# Patient Record
Sex: Female | Born: 1945 | Race: White | Hispanic: No | Smoking: Former smoker
Health system: Southern US, Community
[De-identification: ages and names within clinical notes are randomized; demographics above are authoritative.]

## PROBLEM LIST (undated history)

## (undated) DIAGNOSIS — J449 Chronic obstructive pulmonary disease, unspecified: Secondary | ICD-10-CM

## (undated) DIAGNOSIS — F419 Anxiety disorder, unspecified: Secondary | ICD-10-CM

## (undated) DIAGNOSIS — G8929 Other chronic pain: Secondary | ICD-10-CM

## (undated) DIAGNOSIS — I10 Essential (primary) hypertension: Secondary | ICD-10-CM

## (undated) DIAGNOSIS — C55 Malignant neoplasm of uterus, part unspecified: Secondary | ICD-10-CM

## (undated) HISTORY — DX: Essential (primary) hypertension: I10

## (undated) HISTORY — PX: TOTAL ABDOMINAL HYSTERECTOMY: SHX209

## (undated) HISTORY — DX: Malignant neoplasm of uterus, part unspecified: C55

## (undated) HISTORY — PX: NECK SURGERY: SHX720

## (undated) HISTORY — DX: Anxiety disorder, unspecified: F41.9

## (undated) HISTORY — PX: BILATERAL CARPAL TUNNEL RELEASE: SHX6508

## (undated) HISTORY — DX: Chronic obstructive pulmonary disease, unspecified: J44.9

## (undated) HISTORY — DX: Other chronic pain: G89.29

## (undated) HISTORY — PX: GALLBLADDER SURGERY: SHX652

---

## 1998-07-25 ENCOUNTER — Ambulatory Visit (HOSPITAL_COMMUNITY): Admission: RE | Admit: 1998-07-25 | Discharge: 1998-07-25 | Payer: Self-pay | Admitting: Nephrology

## 1998-08-15 ENCOUNTER — Ambulatory Visit (HOSPITAL_COMMUNITY): Admission: RE | Admit: 1998-08-15 | Discharge: 1998-08-15 | Payer: Self-pay | Admitting: Nephrology

## 1998-10-18 ENCOUNTER — Other Ambulatory Visit: Admission: RE | Admit: 1998-10-18 | Discharge: 1998-10-18 | Payer: Self-pay | Admitting: Nephrology

## 1998-11-28 ENCOUNTER — Encounter: Admission: RE | Admit: 1998-11-28 | Discharge: 1999-01-09 | Payer: Self-pay | Admitting: Neurosurgery

## 1999-01-19 ENCOUNTER — Other Ambulatory Visit: Admission: RE | Admit: 1999-01-19 | Discharge: 1999-01-19 | Payer: Self-pay | Admitting: *Deleted

## 2000-03-08 ENCOUNTER — Encounter: Payer: Self-pay | Admitting: Emergency Medicine

## 2000-03-08 ENCOUNTER — Emergency Department (HOSPITAL_COMMUNITY): Admission: EM | Admit: 2000-03-08 | Discharge: 2000-03-08 | Payer: Self-pay | Admitting: Emergency Medicine

## 2002-12-21 ENCOUNTER — Encounter: Payer: Self-pay | Admitting: Family Medicine

## 2002-12-21 ENCOUNTER — Ambulatory Visit (HOSPITAL_COMMUNITY): Admission: RE | Admit: 2002-12-21 | Discharge: 2002-12-21 | Payer: Self-pay | Admitting: Family Medicine

## 2002-12-30 ENCOUNTER — Encounter: Payer: Self-pay | Admitting: Internal Medicine

## 2002-12-30 ENCOUNTER — Ambulatory Visit (HOSPITAL_COMMUNITY): Admission: RE | Admit: 2002-12-30 | Discharge: 2002-12-30 | Payer: Self-pay | Admitting: Internal Medicine

## 2003-01-07 ENCOUNTER — Ambulatory Visit (HOSPITAL_COMMUNITY): Admission: RE | Admit: 2003-01-07 | Discharge: 2003-01-07 | Payer: Self-pay | Admitting: Internal Medicine

## 2003-01-07 ENCOUNTER — Encounter: Payer: Self-pay | Admitting: Internal Medicine

## 2003-01-13 ENCOUNTER — Encounter: Payer: Self-pay | Admitting: Internal Medicine

## 2003-01-13 ENCOUNTER — Ambulatory Visit (HOSPITAL_COMMUNITY): Admission: RE | Admit: 2003-01-13 | Discharge: 2003-01-13 | Payer: Self-pay | Admitting: Internal Medicine

## 2003-02-09 ENCOUNTER — Encounter: Admission: RE | Admit: 2003-02-09 | Discharge: 2003-02-09 | Payer: Self-pay | Admitting: *Deleted

## 2003-02-09 ENCOUNTER — Encounter (INDEPENDENT_AMBULATORY_CARE_PROVIDER_SITE_OTHER): Payer: Self-pay | Admitting: Specialist

## 2003-02-09 ENCOUNTER — Other Ambulatory Visit: Admission: RE | Admit: 2003-02-09 | Discharge: 2003-02-09 | Payer: Self-pay | Admitting: *Deleted

## 2003-02-23 ENCOUNTER — Ambulatory Visit: Admission: RE | Admit: 2003-02-23 | Discharge: 2003-02-23 | Payer: Self-pay | Admitting: Gynecology

## 2003-05-26 ENCOUNTER — Encounter (INDEPENDENT_AMBULATORY_CARE_PROVIDER_SITE_OTHER): Payer: Self-pay | Admitting: Specialist

## 2003-05-26 ENCOUNTER — Ambulatory Visit: Admission: RE | Admit: 2003-05-26 | Discharge: 2003-05-26 | Payer: Self-pay | Admitting: Gynecology

## 2003-05-26 ENCOUNTER — Encounter: Payer: Self-pay | Admitting: Gynecology

## 2003-06-23 ENCOUNTER — Ambulatory Visit: Admission: RE | Admit: 2003-06-23 | Discharge: 2003-06-23 | Payer: Self-pay | Admitting: Gynecology

## 2003-06-24 ENCOUNTER — Encounter: Payer: Self-pay | Admitting: Gynecology

## 2003-06-29 ENCOUNTER — Observation Stay (HOSPITAL_COMMUNITY): Admission: RE | Admit: 2003-06-29 | Discharge: 2003-06-30 | Payer: Self-pay | Admitting: Obstetrics & Gynecology

## 2003-06-29 ENCOUNTER — Encounter (INDEPENDENT_AMBULATORY_CARE_PROVIDER_SITE_OTHER): Payer: Self-pay

## 2003-08-04 ENCOUNTER — Ambulatory Visit: Admission: RE | Admit: 2003-08-04 | Discharge: 2003-08-04 | Payer: Self-pay | Admitting: Gynecology

## 2003-12-08 ENCOUNTER — Encounter (INDEPENDENT_AMBULATORY_CARE_PROVIDER_SITE_OTHER): Payer: Self-pay | Admitting: Specialist

## 2003-12-08 ENCOUNTER — Other Ambulatory Visit: Admission: RE | Admit: 2003-12-08 | Discharge: 2003-12-08 | Payer: Self-pay | Admitting: Gynecology

## 2003-12-08 ENCOUNTER — Ambulatory Visit: Admission: RE | Admit: 2003-12-08 | Discharge: 2003-12-08 | Payer: Self-pay | Admitting: Gynecology

## 2004-06-21 ENCOUNTER — Encounter (INDEPENDENT_AMBULATORY_CARE_PROVIDER_SITE_OTHER): Payer: Self-pay | Admitting: *Deleted

## 2004-06-21 ENCOUNTER — Ambulatory Visit: Admission: RE | Admit: 2004-06-21 | Discharge: 2004-06-21 | Payer: Self-pay | Admitting: Gynecology

## 2004-06-21 ENCOUNTER — Other Ambulatory Visit: Admission: RE | Admit: 2004-06-21 | Discharge: 2004-06-21 | Payer: Self-pay | Admitting: Gynecology

## 2005-04-23 ENCOUNTER — Emergency Department: Payer: Self-pay | Admitting: Emergency Medicine

## 2005-07-05 ENCOUNTER — Other Ambulatory Visit: Admission: RE | Admit: 2005-07-05 | Discharge: 2005-07-05 | Payer: Self-pay | Admitting: Family Medicine

## 2006-03-12 ENCOUNTER — Encounter: Admission: RE | Admit: 2006-03-12 | Discharge: 2006-03-12 | Payer: Self-pay | Admitting: Family Medicine

## 2006-07-04 ENCOUNTER — Encounter: Admission: RE | Admit: 2006-07-04 | Discharge: 2006-07-04 | Payer: Self-pay | Admitting: Family Medicine

## 2007-06-18 ENCOUNTER — Ambulatory Visit (HOSPITAL_COMMUNITY): Admission: RE | Admit: 2007-06-18 | Discharge: 2007-06-19 | Payer: Self-pay | Admitting: Orthopaedic Surgery

## 2007-09-17 ENCOUNTER — Ambulatory Visit: Payer: Self-pay | Admitting: Internal Medicine

## 2007-09-17 ENCOUNTER — Encounter (INDEPENDENT_AMBULATORY_CARE_PROVIDER_SITE_OTHER): Payer: Self-pay | Admitting: Family Medicine

## 2007-09-17 ENCOUNTER — Ambulatory Visit: Payer: Self-pay | Admitting: *Deleted

## 2007-09-17 LAB — CONVERTED CEMR LAB
Albumin: 4.6 g/dL (ref 3.5–5.2)
Alkaline Phosphatase: 72 units/L (ref 39–117)
CO2: 24 meq/L (ref 19–32)
Cholesterol: 226 mg/dL — ABNORMAL HIGH (ref 0–200)
Creatinine, Ser: 0.83 mg/dL (ref 0.40–1.20)
HDL: 62 mg/dL (ref >39)
Potassium: 4.5 meq/L (ref 3.5–5.3)
Sodium: 138 meq/L (ref 135–145)
Total Protein: 7.6 g/dL (ref 6.0–8.3)
Triglycerides: 232 mg/dL — ABNORMAL HIGH (ref <150)

## 2007-10-22 ENCOUNTER — Ambulatory Visit: Payer: Self-pay | Admitting: Internal Medicine

## 2007-11-04 ENCOUNTER — Ambulatory Visit (HOSPITAL_COMMUNITY): Admission: RE | Admit: 2007-11-04 | Discharge: 2007-11-04 | Payer: Self-pay | Admitting: Family Medicine

## 2007-12-02 ENCOUNTER — Ambulatory Visit: Payer: Self-pay | Admitting: Internal Medicine

## 2007-12-02 ENCOUNTER — Encounter (INDEPENDENT_AMBULATORY_CARE_PROVIDER_SITE_OTHER): Payer: Self-pay | Admitting: Family Medicine

## 2007-12-02 LAB — CONVERTED CEMR LAB
Albumin: 4.1 g/dL (ref 3.5–5.2)
Alkaline Phosphatase: 66 units/L (ref 39–117)
BUN: 12 mg/dL (ref 6–23)
Calcium: 9.9 mg/dL (ref 8.4–10.5)
Glucose, Bld: 95 mg/dL (ref 70–99)
HDL: 52 mg/dL (ref >39)
LDL Cholesterol: 123 mg/dL — ABNORMAL HIGH (ref 0–99)
Total CHOL/HDL Ratio: 4.1
Total Protein: 7.1 g/dL (ref 6.0–8.3)
Triglycerides: 202 mg/dL — ABNORMAL HIGH (ref <150)
VLDL: 40 mg/dL (ref 0–40)

## 2007-12-09 ENCOUNTER — Ambulatory Visit: Payer: Self-pay | Admitting: Internal Medicine

## 2007-12-09 ENCOUNTER — Encounter: Payer: Self-pay | Admitting: Family Medicine

## 2007-12-23 ENCOUNTER — Ambulatory Visit: Payer: Self-pay | Admitting: Family Medicine

## 2008-01-06 ENCOUNTER — Ambulatory Visit (HOSPITAL_COMMUNITY): Admission: RE | Admit: 2008-01-06 | Discharge: 2008-01-06 | Payer: Self-pay | Admitting: Family Medicine

## 2008-01-29 ENCOUNTER — Encounter: Admission: RE | Admit: 2008-01-29 | Discharge: 2008-03-11 | Payer: Self-pay | Admitting: Family Medicine

## 2008-03-08 ENCOUNTER — Encounter (INDEPENDENT_AMBULATORY_CARE_PROVIDER_SITE_OTHER): Payer: Self-pay | Admitting: Family Medicine

## 2008-03-08 ENCOUNTER — Ambulatory Visit: Payer: Self-pay | Admitting: Family Medicine

## 2008-03-08 LAB — CONVERTED CEMR LAB
Basophils Absolute: 0.1 10*3/uL (ref 0.0–0.1)
HCT: 42.6 % (ref 36.0–46.0)
Hemoglobin: 14.1 g/dL (ref 12.0–15.0)
Lymphs Abs: 5.6 10*3/uL — ABNORMAL HIGH (ref 0.7–4.0)
Neutrophils Relative %: 53 % (ref 43–77)
Platelets: 407 10*3/uL — ABNORMAL HIGH (ref 150–400)
RBC: 4.69 M/uL (ref 3.87–5.11)
RDW: 13 % (ref 11.5–15.5)
WBC: 16.4 10*3/uL — ABNORMAL HIGH (ref 4.0–10.5)

## 2008-03-12 ENCOUNTER — Emergency Department: Payer: Self-pay | Admitting: Emergency Medicine

## 2008-03-25 ENCOUNTER — Ambulatory Visit (HOSPITAL_COMMUNITY): Admission: RE | Admit: 2008-03-25 | Discharge: 2008-03-25 | Payer: Self-pay | Admitting: Family Medicine

## 2008-04-19 ENCOUNTER — Ambulatory Visit: Payer: Self-pay | Admitting: Internal Medicine

## 2008-05-11 ENCOUNTER — Ambulatory Visit: Payer: Self-pay | Admitting: Internal Medicine

## 2008-05-20 ENCOUNTER — Ambulatory Visit (HOSPITAL_COMMUNITY): Admission: RE | Admit: 2008-05-20 | Discharge: 2008-05-20 | Payer: Self-pay | Admitting: Family Medicine

## 2008-06-10 ENCOUNTER — Encounter (INDEPENDENT_AMBULATORY_CARE_PROVIDER_SITE_OTHER): Payer: Self-pay | Admitting: Family Medicine

## 2008-06-10 ENCOUNTER — Ambulatory Visit: Payer: Self-pay | Admitting: Internal Medicine

## 2008-06-10 LAB — CONVERTED CEMR LAB
ALT: 14 units/L (ref 0–35)
Albumin: 4.4 g/dL (ref 3.5–5.2)
BUN: 9 mg/dL (ref 6–23)
CO2: 27 meq/L (ref 19–32)
Calcium: 9.6 mg/dL (ref 8.4–10.5)
Chloride: 103 meq/L (ref 96–112)
Cholesterol: 150 mg/dL (ref 0–200)
Creatinine, Ser: 0.82 mg/dL (ref 0.40–1.20)
Glucose, Bld: 105 mg/dL — ABNORMAL HIGH (ref 70–99)
HDL: 57 mg/dL (ref >39)
Total CHOL/HDL Ratio: 2.6
Triglycerides: 101 mg/dL (ref <150)

## 2008-06-18 ENCOUNTER — Ambulatory Visit: Payer: Self-pay | Admitting: Internal Medicine

## 2008-07-13 ENCOUNTER — Ambulatory Visit: Payer: Self-pay | Admitting: Internal Medicine

## 2008-10-05 ENCOUNTER — Ambulatory Visit: Payer: Self-pay | Admitting: Family Medicine

## 2008-11-25 ENCOUNTER — Ambulatory Visit: Payer: Self-pay | Admitting: Family Medicine

## 2008-12-08 ENCOUNTER — Ambulatory Visit (HOSPITAL_COMMUNITY): Admission: RE | Admit: 2008-12-08 | Discharge: 2008-12-08 | Payer: Self-pay | Admitting: Family Medicine

## 2008-12-09 ENCOUNTER — Ambulatory Visit (HOSPITAL_COMMUNITY): Admission: RE | Admit: 2008-12-09 | Discharge: 2008-12-09 | Payer: Self-pay | Admitting: Internal Medicine

## 2009-01-27 ENCOUNTER — Ambulatory Visit: Payer: Self-pay | Admitting: Internal Medicine

## 2009-03-08 ENCOUNTER — Ambulatory Visit: Payer: Self-pay | Admitting: Internal Medicine

## 2009-03-22 ENCOUNTER — Ambulatory Visit: Payer: Self-pay | Admitting: Family Medicine

## 2009-08-03 ENCOUNTER — Ambulatory Visit: Payer: Self-pay | Admitting: Internal Medicine

## 2009-08-03 ENCOUNTER — Encounter (INDEPENDENT_AMBULATORY_CARE_PROVIDER_SITE_OTHER): Payer: Self-pay | Admitting: Adult Health

## 2009-08-03 LAB — CONVERTED CEMR LAB
Albumin: 4.1 g/dL (ref 3.5–5.2)
Alkaline Phosphatase: 77 units/L (ref 39–117)
Basophils Absolute: 0.1 10*3/uL (ref 0.0–0.1)
Basophils Relative: 0 % (ref 0–1)
CO2: 24 meq/L (ref 19–32)
Calcium: 9.5 mg/dL (ref 8.4–10.5)
Cholesterol: 152 mg/dL (ref 0–200)
Creatinine, Ser: 0.77 mg/dL (ref 0.40–1.20)
Eosinophils Absolute: 0.7 10*3/uL (ref 0.0–0.7)
Glucose, Bld: 66 mg/dL — ABNORMAL LOW (ref 70–99)
HDL: 53 mg/dL (ref >39)
Monocytes Absolute: 1.2 10*3/uL — ABNORMAL HIGH (ref 0.1–1.0)
Platelets: 447 10*3/uL — ABNORMAL HIGH (ref 150–400)
Sodium: 138 meq/L (ref 135–145)
Total Bilirubin: 0.4 mg/dL (ref 0.3–1.2)
Total CHOL/HDL Ratio: 2.9

## 2009-08-04 ENCOUNTER — Encounter (INDEPENDENT_AMBULATORY_CARE_PROVIDER_SITE_OTHER): Payer: Self-pay | Admitting: Adult Health

## 2009-08-23 ENCOUNTER — Ambulatory Visit: Payer: Self-pay | Admitting: Internal Medicine

## 2009-08-25 ENCOUNTER — Ambulatory Visit: Payer: Self-pay | Admitting: Internal Medicine

## 2009-08-25 ENCOUNTER — Encounter (INDEPENDENT_AMBULATORY_CARE_PROVIDER_SITE_OTHER): Payer: Self-pay | Admitting: Adult Health

## 2009-08-25 LAB — CONVERTED CEMR LAB
Eosinophils Absolute: 1 10*3/uL — ABNORMAL HIGH (ref 0.0–0.7)
Eosinophils Relative: 7 % — ABNORMAL HIGH (ref 0–5)
Hemoglobin, Urine: NEGATIVE
Ketones, ur: NEGATIVE mg/dL
Leukocytes, UA: NEGATIVE
Lymphocytes Relative: 27 % (ref 12–46)
MCHC: 31.2 g/dL (ref 30.0–36.0)
Monocytes Absolute: 1.4 10*3/uL — ABNORMAL HIGH (ref 0.1–1.0)
Neutrophils Relative %: 55 % (ref 43–77)
Protein, ur: NEGATIVE mg/dL
RBC: 4.46 M/uL (ref 3.87–5.11)
Specific Gravity, Urine: 1.01 (ref 1.005–1.030)
Urine Glucose: NEGATIVE mg/dL
Urobilinogen, UA: 0.2 (ref 0.0–1.0)
WBC: 13.7 10*3/uL — ABNORMAL HIGH (ref 4.0–10.5)

## 2009-08-26 ENCOUNTER — Encounter (INDEPENDENT_AMBULATORY_CARE_PROVIDER_SITE_OTHER): Payer: Self-pay | Admitting: Adult Health

## 2009-09-22 ENCOUNTER — Ambulatory Visit: Payer: Self-pay | Admitting: Internal Medicine

## 2010-12-07 ENCOUNTER — Ambulatory Visit: Payer: Self-pay | Admitting: Internal Medicine

## 2010-12-07 LAB — PULMONARY FUNCTION TEST

## 2010-12-27 ENCOUNTER — Telehealth (INDEPENDENT_AMBULATORY_CARE_PROVIDER_SITE_OTHER): Payer: Self-pay | Admitting: *Deleted

## 2011-01-25 NOTE — Progress Notes (Signed)
Summary: pft results  Phone Note From Other Clinic   Caller: dr Carollee Massed [phone] Summary of Call: Dr. Janee Morn asking for copy of PFT done 12/15.  Please fax to [phone] attn: Dr. Clovis Riley Initial call taken by: Lehman Prom,  December 27, 2010 8:46 AM  Follow-up for Phone Call        I have refaxed the PFT results-they were sent on 12-11-10 orginally.[name] CMA  December 27, 2010 10:34 AM

## 2011-01-25 NOTE — Miscellaneous (Signed)
Summary: Orders Update pft charges  Clinical Lists Changes  Orders: Added new Service order of Carbon Monoxide diffusing w/capacity (94720) - Signed Added new Service order of Lung Volumes (94240) - Signed Added new Service order of Spirometry (Pre & Post) (94060) - Signed 

## 2011-05-08 NOTE — Op Note (Signed)
Kelsey Long, Kelsey Long              ACCOUNT NO.:  [id number]   MEDICAL RECORD NO.:  [id number]          PATIENT TYPE:  AMB   LOCATION:  SDS                          FACILITY:  [facility]   PHYSICIAN:  Mark C. Ophelia Charter, M.D.    DATE OF BIRTH:  29-Dec-1945   DATE OF PROCEDURE:  06/18/2007  DATE OF DISCHARGE:                               OPERATIVE REPORT   POSTOPERATIVE DIAGNOSIS:  C5-C6 and C6-C7 spondylosis.   POSTOPERATIVE DIAGNOSIS:  C5-C6 and C6-C7 spondylosis.   PROCEDURE:  C5-C6 and C6-C7 anterior cervical discectomy and fusion,  allograft and plating, application of external bone stimulator.   SURGEON:  Mark C. Ophelia Charter, M.D.   ASSISTANT:  Wende Neighbors, P.A.-C.   ANESTHESIA:  GOT.   DRAINS:  One Hemovac neck.   DESCRIPTION OF PROCEDURE:  After induction of general anesthesia and  orotracheal intubation, head halter traction application without weight,  standard prepping of the neck was performed, sterile Mayo stand at the  head, the area squared with towels.  A sterile skin marker was used and  Betadine Vidrape and thyroid sheet.  An incision was made at the midline  after time out procedure and preoperative antibiotics.  The platysma was  split in line with the fibers.  Blunt dissection down to the prominent  spurs at C5-C6 and C6-c7 was performed.  A short 25 needle was placed at  C5-C6 and confirmed with C-arm lateral position.  C5-C6 level surgically  addressed first.  Discectomy was performed with scalpel, pituitary  Cloward curets.  The power bur was used 4 mm for removal of the anterior  prominent spurs and cautery was used to control some bleeding of the  longus colli.  The operating microscope was draped and brought in.  Continued discectomy back to the posterior longitudinal ligament. Spurs  were removed which were overhanging and there was more tightness on the  left than right.  The posterior longitudinal ligament was taken down.  There was extruded fragment out in  the right foramina which was  delivered and removed.  Once spurs were removed, the cord was  decompressed, the dura was well visualized.  Trial sizers showed that  additional bone had to be removed from the left side.  Initially, a 6 mm  was tried and it was not quite proper tension and a 7 mm lordotic graft  was selected after trial.  It was impacted into place, rotated slightly  on insertion since it slightly tighter on the left than right.  There  was good bone contact.  The graft was grasped with a Black nerve hook  and attempted to be pulled, was stable and extremely tight.  Self-  retaining Cloward tractors were then moved to C6-C7 and the procedure  was repeated.  The space was tighter with less motion minimal space.  Posterior longitudinal ligament was taken down. Sizing showed a 6 gave a  tight fit.  The graft was inserted, maintained in position, countersunk  slightly, spurs removed off the anterior aspect.  A plate was selected  which was a 30 mm Biomet Viewlock plate.  All six holes were filled,  checked under fluoroscopy. Fluoroscopy was oblique to see the bottom  screws in the C7 vertebra.  After irrigation with saline solution, final  spot fluoro pictures were taken including an  AP.  Instrument and needle count was correct and the platysma was closed  with 3-0 Vicryl, 4-0 Vicryl subcuticular closure.  Tincture of Benzoin,  Steri-Strips, Marcaine infiltration, postop dressing. Application of  bone stimulator patches and soft cervical collar.  The patient was  extubated and transferred to the recovery room in stable condition.      Mark C. Ophelia Charter, M.D.  Electronically Signed     MCY/MEDQ  D:  06/18/2007  T:  06/18/2007  Job:  [id number]

## 2011-05-11 NOTE — Consult Note (Signed)
NAME:  Kelsey Long, Kelsey Long                        ACCOUNT NO.:  [id number]   MEDICAL RECORD NO.:  [id number]                   PATIENT TYPE:  OUT   LOCATION:  GYN                                  FACILITY:  [facility]   PHYSICIAN:  De Blanch, M.D.         DATE OF BIRTH:  1946/07/21   DATE OF CONSULTATION:  05/26/2003  DATE OF DISCHARGE:                                   CONSULTATION   HISTORY OF PRESENT ILLNESS:  A 65 year old white female returns for follow-  up of an ovarian cyst and endometrial hyperplasia with atypia.  At our  initial visit we placed the patient on Provera 10 mg daily for the first 12  days of each month (the patient had refused hysterectomy which had been  advised for treatment of her hyperplasia with atypia).  She apparently has  tolerated the Provera well.  She has had minimal pinkish discharge and not  really had a menstrual period during the time that she has been on the  Provera.  She denies any other pelvic pain or pressure.  No vaginal bleeding  or discharge.  She has been working full-time and is in line to be promoted  further.   PAST MEDICAL HISTORY:  1. Hypertension.  2. Low back pain.   PAST SURGICAL HISTORY:  1. Laparoscopic surgery for endometriosis 1978.  2. Open cholecystectomy 1976.   PAST OBSTETRICAL HISTORY:  Gravida 3.   ALLERGIES:  PENICILLIN and SULFA.   SOCIAL HISTORY:  The patient smokes one pack per day.  She is working full-  time.   REVIEW OF SYSTEMS:  No cardiovascular, pulmonary, GI, GU symptoms.   PHYSICAL EXAMINATION:  VITAL SIGNS:  Weight 149 pounds.  GENERAL:  The patient is a healthy white female in no acute distress.  HEENT:  Negative.  NECK:  Supple without thyromegaly.  LYMPH:  There is no supraclavicular or inguinal adenopathy.  ABDOMEN:  Soft, nontender.  No mass, organomegaly, ascites, or hernias  noted.  PELVIC:  EGBUS, vagina, bladder, urethra are normal.  Cervix appears normal.  The uterus is  anterior, normal shape, size, consistency.  I do not feel any  adnexal masses.  Rectovaginal examination confirms.   PROCEDURE NOTE:  Endometrial biopsy is obtained without difficulty using a  Pipelle.  The patient is given ibuprofen following the procedure.   The patient also had an ultrasound earlier today which basically is  unchanged.  She remains with a right ovarian cyst measuring 6.4 x 3.8 x 5.8  cm.  The left ovary appears normal, has a small cyst which is 1.7 x 1.3 x  1.5 cm.  Endometrial stripe is 5.7 mm.   IMPRESSION:  Persistent stable ovarian cyst.   Endometrial hyperplasia with atypia status post three months of cyclic  Provera.   W will await the endometrial biopsy before making further recommendation.  If she continues to have hyperplasia, I have advised her that she should  have hysterectomy.  She is very resistant to this given her current work  situation.  Will therefore treat her with continuous Provera 10 mg daily for  the next three months and then reassess with biopsy.  We will also repeat an  ultrasound in three months to evaluate her ovarian cyst.                                               De Blanch, M.D.    DC/MEDQ  D:  05/26/2003  T:  05/26/2003  Job:  [id number]   cc:   Telford Nab, R.N.

## 2011-05-11 NOTE — Consult Note (Signed)
NAME:  Kelsey Long, Kelsey Long                        ACCOUNT NO.:  [id number]   MEDICAL RECORD NO.:  [id number]                   PATIENT TYPE:  OUT   LOCATION:  GYN                                  [facility]ACILITY:  [facility]   PHYSICIAN:  De Blanch, M.D.         DATE OF BIRTH:  December 17, 1946   DATE OF CONSULTATION:  06/21/2004  DATE OF DISCHARGE:                                   CONSULTATION   A 65 year old white female who returns for continued follow-up of stage I-B,  grade 1 endometrial cancer.   INTERVAL HISTORY:  Since her last visit, the patient has done well.  She saw  Roseanna Rainbow, M.D., three months ago, and no problems were  identified.   HISTORY OF PRESENT ILLNESS:  The patient underwent a laparoscopically-  assisted vaginal hysterectomy and bilateral salpingo-oophorectomy in July  2004 for stage I-B, grade 1 endometrial cancer.   PAST MEDICAL HISTORY:  Medical illnesses:  Irritable bowel syndrome,  hypertension, low back pain.   PAST SURGICAL HISTORY:  1. Laparoscopic surgery for endometriosis, 1978.  2. Open cholecystectomy.  3. Laparoscopically-assisted vaginal hysterectomy and bilateral salpingo-     oophorectomy in July 2004.   OBSTETRICAL HISTORY:  Gravida 3.   DRUG ALLERGIES:  PENICILLIN and SULFA.   SOCIAL HISTORY:  The patient smokes a pack a day.  She works full-time.   REVIEW OF SYSTEMS:  Negative.   PHYSICAL EXAMINATION:  VITAL SIGNS:  Weight 150 pounds.  Blood pressure  170/100.  GENERAL:  The patient is a healthy white female in no acute distress.  HEENT:  Negative.  NECK:  Supple without thyromegaly.  LYMPHATIC:  There is no supraclavicular or inguinal adenopathy.  ABDOMEN:  Soft, nontender.  No mass, organomegaly, ascites, or hernia is  noted.  All incisions are well-healed.  PELVIC:  EG/BUS, vagina, bladder, and urethra area normal.  The cuff is well-  healed, well-supported.  No lesions are noted.  Bimanual and rectovaginal  exam reveal  no masses, induration, or nodularity.   IMPRESSION:  Stage I-B, grade 1 endometrial cancer, no evidence of recurrent  disease.   PLAN:  Pap smears are obtained.  The patient will return to see Roseanna Rainbow, M.D., in four months and return to see [place] in eight months.                                               De Blanch, M.D.    DC/MEDQ  D:  06/21/2004  T:  06/21/2004  Job:  [id number]   cc:   Roseanna Rainbow, M.D.   Telford Nab, R.N.  [address]  [place], [place] [address]

## 2011-05-11 NOTE — Consult Note (Signed)
NAME:  Kelsey Long, Kelsey Long                        ACCOUNT NO.:  [id number]   MEDICAL RECORD NO.:  [id number]                   PATIENT TYPE:  OUT   LOCATION:  GYN                                  FACILITY:  [facility]   PHYSICIAN:  De Blanch, M.D.         DATE OF BIRTH:  05-13-1946   DATE OF CONSULTATION:  02/23/2003  DATE OF DISCHARGE:                                   CONSULTATION   A 65 year old white female seen in consultation at the request of Mary Sella.  Orlene Erm, M.D. for evaluation of atypical complex endometrial hyperplasia and a  simple ovarian cyst measuring 6.5 x 3.9 x 3.8 cm.   The patient initially presented to Mary Sella. Orlene Erm, M.D. with right lower rib  pain and had a CT scan revealing a cyst in the ovary and an inhomogeneous  uterus.  Ultrasound was obtained for uterine fibroids, a simple cyst.  Endometrial biopsy was obtained because of endometrial stripe measuring 6  mm.  The patient was found to have atypical hyperplasia and endometrial  carcinoma could not be excluded.   PAST MEDICAL HISTORY:  1. Hypertension.  2. Low back pain.   PAST SURGICAL HISTORY:  1. Laparoscopic surgery for endometriosis 1978.  2. Open cholecystectomy 1976.   PAST OBSTETRICAL HISTORY:  Gravida 3.   ALLERGIES:  PENICILLIN and SULFA.   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  She does not have any health insurance at her current job.   REVIEW OF SYSTEMS:  No cardiovascular, pulmonary, GI, or GU symptoms.  She  has no neurologic or musculoskeletal symptoms.  She does not have any  vaginal bleeding.   PHYSICAL EXAMINATION:  VITAL SIGNS:  Weight 152 pounds, blood pressure  148/82.  GENERAL:  The patient is a healthy white female in no acute distress.  HEENT:  Negative.  NECK:  Supple without thyromegaly.  LYMPH:  There is no supraclavicular or inguinal adenopathy.  ABDOMEN:  Soft, nontender.  No masses, organomegaly, ascites, or  hernias  noted.  PELVIC:  EGBUS, vagina, bladder, urethra normal.  Cervix is normal.  Uterus  is anterior, normal shape, size, consistency.  Posterior to the uterus is a  smooth walled palpable cyst measuring approximately 6 cm in diameter.   IMPRESSION:  1. Complex hyperplasia with atypia.  2. Simple ovarian cyst which is asymptomatic.   PLAN:  I would recommend to the patient that she undergo total abdominal  hysterectomy, bilateral salpingo-oophorectomy which I believe would be  standard of care for these conditions.   However, the patient indicates that she would like to pursue nonsurgical  approach as she has no health insurance and really cannot afford to take  time off from work at the present time since she is the sole Shamaria Kavan for  her family.   I discussed with her alternative approaches to managing endometrial  hyperplasia with  cyclic progestins.  She would like to try this and she is  therefore given a prescription for Provera 10 mg daily for the first 12 days  of each month.  We will use this for three months and then have the patient  return and have a repeat D&C at that time.   With regard to ovarian cyst, it does appear to be benign and her CA-125  value is normal (14.5).  I believe this is a benign ovarian cyst and since  it is asymptomatic and less than 10 cm, we will follow it and have a repeat  ultrasound in approximately three months.   I indicated to the patient that our alternative approach is not exactly  optimal, but given her desires to avoid surgery we will attempt to manage  her medically as outlined above.  She is in agreement to be compliant with  our recommendations and she will return to see [place] in three months for  continuing follow-up.                                               De Blanch, M.D.    DC/MEDQ  D:  02/23/2003  T:  02/23/2003  Job:  [id number]   cc:   Mary Sella. Orlene Erm, M.D.  [address]  [place]  [place] [address]   Fax: [phone]   Telford Nab, R.N.

## 2011-05-11 NOTE — Consult Note (Signed)
NAME:  Kelsey Long, Kelsey Long                        ACCOUNT NO.:  [id number]   MEDICAL RECORD NO.:  [id number]                   PATIENT TYPE:  OUT   LOCATION:  GYN                                  FACILITY:  [facility]   PHYSICIAN:  De Blanch, M.D.         DATE OF BIRTH:  [birth year]   DATE OF CONSULTATION:  06/23/2003  DATE OF DISCHARGE:                                   CONSULTATION   HISTORY:  The patient is a 65 year old white female who returns for  preoperative evaluation, counseling and consent.   At her last visit, an endometrial biopsy was obtained showing a grade I  endometrial adenocarcinoma.  The patient had previously been treated with  Provera hoping to treat atypical adenomatous hyperplasia.  We have advised  the patient undergo a hysterectomy.  She has recently taken on a new job and  has health insurance, and would like to go ahead with surgery which we have  scheduled for next week.   She is not having any bleeding at the present time.   PAST MEDICAL HISTORY:  1. Hypertension.  2. Low back pain.   PAST SURGICAL HISTORY:  1. Laparoscopic surgery for endometriosis in 1978.  2. Open cholecystectomy in 1976.   OBSTETRICAL HISTORY:  Gravida 3.   DRUG ALLERGIES:  PENICILLIN and SULFA.   SOCIAL HISTORY:  The patient smokes one pack per day.   REVIEW OF SYSTEMS:  Negative except as noted above.   PHYSICAL EXAMINATION:  VITAL SIGNS:  Weight 149 pounds, blood pressure  128/78.  GENERAL:  The patient is a healthy white female in no acute distress.  HEENT:  Negative.  NECK:  Supple without thyromegaly.  There is no supraclavicular or inguinal  adenopathy.  ABDOMEN:  Soft and nontender.  No masses, organomegaly, ascites, or hernias  are noted.  PELVIC:  EG-BUS, vagina, bladder, and urethra are normal.  Cervix is parous  and normal.  Uterus is anterior, normal shape, size, and consistency.  Rectovaginal exam confirms.   IMPRESSION:  Endometrial cancer in an  otherwise reasonably healthy patient.   RECOMMENDATIONS:  Recommend the patient undergo abdominal hysterectomy and  bilateral salpingo-oophorectomy and possible lymphadenectomy if she has deep  invasion or higher grade lesion.  The patient is strongly desirous of having  the procedure done laparoscopically if at all possible.  She does have not  great descensus on pelvic examination.  However, I think it is worth trying  to perform a laparoscopically assisted vaginal hysterectomy and bilateral  salpingo-oophorectomy.  She understands she may require an incision if we  cannot perform the procedure safely with the laparoscope, or if we are  unable to perform the vaginal hysterectomy or if she has deep invasion  necessitating node dissection.   The patient is aware of the risks of surgery including hemorrhage,  infection, injury to adjacent viscera, thromboembolic complications, and  anesthetic risks.  And, she agrees to go ahead with  surgery which we will  schedule for June 29, 2003.                                               De Blanch, M.D.    DC/MEDQ  D:  06/23/2003  T:  06/23/2003  Job:  [id number]   cc:   Telford Nab, R.N.  [address]  [place], [place] [address]   Roseanna Rainbow, M.D.  [address]  [place]  [place] [address]  Fax: [phone]

## 2011-05-11 NOTE — Consult Note (Signed)
NAME:  Kelsey Long, Kelsey Long                        ACCOUNT NO.:  [id number]   MEDICAL RECORD NO.:  [id number]                   PATIENT TYPE:  OUT   LOCATION:  GYN                                  [facility]ACILITY[facility]   PHYSICIAN:  De Blanch, M.D.         DATE OF BIRTH:  09/25/1946   DATE OF CONSULTATION:  12/08/2003  DATE OF DISCHARGE:                                   CONSULTATION   REASON FOR CONSULTATION:  A 65 year old white female returns for continuing  followup of a stage IB grade 1 endometrial carcinoma.  She was treated by  laparoscopically assisted vaginal hysterectomy and bilateral salpingo-  oophorectomy on June 29, 2003.   She had good prognostic features and required no additional postoperative  therapy.   Since her last visit the patient has done well.  She denies any GI or GU  symptoms.  She does have some lower abdominal discomfort.  She has had no  further vaginal spotting.   Functional status is very good.   PAST MEDICAL HISTORY:  1. Irritable bowel syndrome.  2. Hypertension.  3. Low back pain.   PAST SURGICAL HISTORY:  1. Laparoscopic surgery for endometriosis in 1978.  2. Open cholesterol in 1976.  3. Laparoscopically assisted vaginal hysterectomy and bilateral salpingo-     oophorectomy in 2004.   OBSTETRICAL HISTORY:  Gravida 3.   ALLERGIES:  1. PENICILLIN.  2. SULFA.   SOCIAL HISTORY:  The patient smokes one pack per day.   REVIEW OF SYSTEMS:  Essentially negative except as noted above.   PHYSICAL EXAMINATION:  VITAL SIGNS:  Weight 153 pounds, blood pressure  160/90.  GENERAL:  The patient is a healthy white female in no acute distress.  HEENT:  Negative.  NECK:  Supple without thyromegaly.  There is no supraclavicular or inguinal  adenopathy.  ABDOMEN:  Soft, nontender.  No mass, organomegaly, ascites, or hernias are  noted.  All incisions all well-healed.  PELVIC:  PELVIC:  EGBUS, vagina, bladder, and urethra are normal.  The cuff  is well supported and no lesions are noted.  Cervix and uterus are  surgically absent.  Adnexa without masses.  Rectovaginal examination  confirms.  Stool is guaiac negative.  EXTREMITIES:  Lower extremities are without edema or varicosities.   IMPRESSION:  Stage IB grade 1 endometrial adenocarcinoma in July 2004.  No  evidence of recurrent disease.  Pap smears are obtained.  We will now plan  to alternate visits between Dr. Antionette Char and myself.  We will have  her see Dr. Tamela Oddi in March 2005, and I will plan on seeing her back  again in June 2005.                                               De Blanch, M.D.  DC/MEDQ  D:  12/08/2003  T:  12/08/2003  Job:  [id number]   cc:   Roseanna Rainbow, M.D.  [address]  [place]  [place] [address]  Fax: [phone]   Telford Nab, R.N.  [address]  [place], [place] [address]

## 2011-05-11 NOTE — Op Note (Signed)
NAME:  Kelsey Long, Kelsey Long                        ACCOUNT NO.:  [id number]   MEDICAL RECORD NO.:  [id number]                   PATIENT TYPE:  INP   LOCATION:  0006                                 FACILITY:  [facility]   PHYSICIAN:  De Blanch, M.D.         DATE OF BIRTH:  21-Apr-1946   DATE OF PROCEDURE:  DATE OF DISCHARGE:                                 OPERATIVE REPORT   PREOPERATIVE DIAGNOSES:  1. Grade 1 endometrial carcinoma.  2. Ovarian cyst.   POSTOPERATIVE DIAGNOSES:  1. Grade 1 endometrial carcinoma with minimal myometrial invasion.  2. Serocystadenoma of the right ovary.   OPERATION/PROCEDURE:  Laparoscopic-assisted vaginal hysterectomy and  bilateral salpingo-oophorectomy.   SURGEON:  De Blanch, M.D.   ASSISTANT:  Roseanna Rainbow, M.D.  Telford Nab, R.N.   ANESTHESIA:  General.   ESTIMATED BLOOD LOSS:  50 mL.   SURGICAL FINDINGS:  At the time of laparoscopy the upper abdomen was  visualized and there were no evidence of metastatic disease.  There were no  enlarged pelvic lymph nodes.  The uterus was essentially normal size and the  left tube and ovary were normal.  The right tube was normal.  On the right  ovary was approximately a 6-7 cm ovarian cyst which on frozen section was a  serocystadenoma. Further dissection also informed us that the depth of  myometrium was less than one-third of the myometrium.   DESCRIPTION OF PROCEDURE:  The patient was brought to the operating room and  after satisfactory attainment of general anesthesia, was placed in the  modified lithotomy position in [place] stirrups.  The anterior abdominal wall,  perineum and vagina were prepped and draped.  A Foley catheter was inserted  sterilely.  A Hulka tenaculum was placed in the uterus to elevate and  manipulate it.   Vertical incision was made in the umbilicus and the peritoneal cavity opened  under direct visualization.  Sutures were placed in the fascia  and the  Hasson cannula was placed in the peritoneal cavity.  The peritoneal cavity  was then insufflated with carbon dioxide and the upper abdomen was  visualized.  The patient was placed in steep Trendelenburg position.  A 12  mm suprapubic port and two 5 mm ports were placed under direct  visualization, care taken to avoid injury to the bladder and the inferior  epigastric vessels.  Peritoneal washings were taken from the pelvis and  submitted to cytopathology.  Attention was turned to the right ovary which  was cystic.  The right round ligament was divided with cautery and the  peritoneum incised lateral to the external iliac artery.  The  retroperitoneal space was opened, identifying the external and internal  iliac arteries and the ureter. The ovarian vessels were skeletonized and  window was made in the peritoneum beneath the ovarian vessels and a linear  stapling device placed across this pedicle.  This was fired.  The peritoneum  beneath the cyst along the posterior leaf of the broad ligament was then  incised, mobilizing the tube and ovary down toward the uterus.  Once these  were fully mobilized with direct visualization of the ureter, the linear  stapling device the proximal fallopian tube and uterine ovarian anastomosis.  This was fired.  An EndoCatch bag was placed in the peritoneal cavity and  the cyst and ovary were placed in the bag and pulled up to the skin level.  Using an 18-gauge needle and a 50 mL syringe, approximately 60 mL of clear  fluid was drained from the cyst.  The cyst was then delivered to the  suprapubic incision and submitted to frozen section with the above-noted  findings.   The left round ligament was divided and the left retroperitoneum space  opened, again identified the ureter and vessels. The ovarian vessels were  skeletonized and then a linear stapling device was placed across them.  Hemostasis on the staple line was excellent.  The bladder flap  was then  advanced with sharp and blunt dissection.   The procedure then with vaginal hysterectomy.  Weighted speculum was placed  in the posterior vagina and Deaver elevated the bladder.  The cervix was  grasped with Lahey tenaculum and the vaginal cervical junction was injected  with lidocaine with 1% epinephrine.  An incision was made in the vaginal  mucosa from 3 to 9 o'clock.  Bladder flap was then advanced with sharp and  blunt dissection off the cervix and lower uterine segment until the anterior  cul-de-sac was entered.  The posterior vaginal wall was grasped and an  incision made into the posterior cul-de-sac across the peritoneum.  Uterosacral ligaments were then clamped, cut and suture ligated.  In a step-  wise fashion the paracervical and cardinal ligaments were clamped, cut and  suture ligated. The uterine vessels were then clamped, cut and suture  ligated.  The uterus was inverted into the posterior cul-de-sac and the  remaining pedicles crossclamped and suture ligated.  The uterus, cervix and  left tube and ovary were handed off the operative field for frozen section  analysis with depth of invasion.   Uterosacral ligaments were then transfixed to the vaginal angles.  The  vagina was then closed with a horizontal fashion incorporating a vaginal  mucosa and peritoneum in a single layer.  This was accomplished with a  series of sutures across the apex of the vagina.  Finally the uterosacral  ligaments, which had been brought out through vaginal angles, were tied.  Hemostasis was excellent.   Attention was turned to the upper abdomen.  Gloves were changed to sterile  gloves.  The laparoscope was reinserted.  The pelvis was inspected.  All  pedicles and vaginal cuff were identified and found to be free of any  bleeding.  The suprapubic fascia site was closed in interrupted figure-of- eight sutures of 0 Vicryl.  The ports were all removed under direct  visualization.  No  bleeding was noted.  The umbilical port fascia was closed  with interrupted figure-of-eight sutures of 0 Vicryl.  All skin closures  were accomplished using subcuticular 3-0 Vicryl sutures.  The abdomen was  rinsed, dried.  Benzoin applied and Steri-Strips applied.   Dressings were applied. The patient was awakened from anesthesia and taken  to recovery room in satisfactory condition.  Sponge, needle and instrument  counts were correct x2.  De Blanch, M.D.    DC/MEDQ  D:  06/29/2003  T:  06/29/2003  Job:  [id number]   cc:   Roseanna Rainbow, M.D.  [address]  [place]  [place] [address]  Fax: [phone]   Telford Nab, R.N.  [address]  [place], [place] [address]

## 2011-05-11 NOTE — Consult Note (Signed)
   NAME:  Kelsey Long, Kelsey Long                        ACCOUNT NO.:  [id number]   MEDICAL RECORD NO.:  [id number]                   PATIENT TYPE:  OUT   LOCATION:  GYN                                  [facility]ACILITY:  [facility]   PHYSICIAN:  De Blanch, M.D.         DATE OF BIRTH:  1946-08-10   DATE OF CONSULTATION:  DATE OF DISCHARGE:                                   CONSULTATION   CHIEF COMPLAINT:  A 65 year old white female who returns now five weeks  following laparoscopy-assisted vaginal hysterectomy and bilateral salpingo-  oophorectomy.  Final pathology showed a well-differentiated endometrioid  carcinoma invading 0.4 mm out of 15 mm thick myometrium.  Peritoneal  washings were negative (stage Ib grade 1).  The patient had an uncomplicated  postoperative course.   Today she feels well.  She has some lower abdominal discomfort and  occasional vaginal spotting.   PHYSICAL EXAMINATION:  VITAL SIGNS:  Weight 150 pounds, blood pressure  144/84.  GENERAL:  The patient is a healthy white female in no acute distress.  HEENT:  Negative.  NECK:  Supple without thyromegaly.  NODES:  There is no supraclavicular or inguinal adenopathy.  ABDOMEN:  Soft, nontender.  No mass, organomegaly, ascites, or hernias are  noted.  The laparoscopic incisions are all healed very nicely.  PELVIC:  EG/BUS, vagina, bladder, urethra are normal.  The cuff has a  minimal amount of granulation tissue and there are still some Vicryl sutures  present.  Bimanual exam reveals minimal postoperative induration.   IMPRESSION:  Stage Ib grade 1 endometrial carcinoma with a good  postoperative recovery.  She can return to work beginning in one week.  She  will return to see [place] in four months to continue her surveillance.                                               De Blanch, M.D.    DC/MEDQ  D:  08/04/2003  T:  08/04/2003  Job:  [id number]   cc:   Telford Nab, R.N.  [address]  [place], [place] [address]   Roseanna Rainbow, M.D.  [address]  [place]  [place] [address]  Fax: [phone]

## 2011-10-10 LAB — URINALYSIS, ROUTINE W REFLEX MICROSCOPIC
Glucose, UA: NEGATIVE
Hgb urine dipstick: NEGATIVE
Ketones, ur: NEGATIVE
Urobilinogen, UA: 0.2
pH: 7

## 2011-10-10 LAB — CBC
Hemoglobin: 14.5
RBC: 4.72

## 2011-10-10 LAB — COMPREHENSIVE METABOLIC PANEL
ALT: 16
Albumin: 3.9
Calcium: 9.8
Chloride: 94 — ABNORMAL LOW
GFR calc non Af Amer: >60
Total Bilirubin: 0.7
Total Protein: 7.1

## 2012-09-12 ENCOUNTER — Encounter: Payer: Self-pay | Admitting: Pulmonary Disease

## 2012-09-15 ENCOUNTER — Institutional Professional Consult (permissible substitution): Payer: Self-pay | Admitting: Pulmonary Disease

## 2012-09-24 ENCOUNTER — Encounter: Payer: Self-pay | Admitting: Pulmonary Disease

## 2012-09-24 ENCOUNTER — Ambulatory Visit (INDEPENDENT_AMBULATORY_CARE_PROVIDER_SITE_OTHER)
Admission: RE | Admit: 2012-09-24 | Discharge: 2012-09-24 | Disposition: A | Discharge status: Home / Self Care | Payer: Medicare Other | Source: Ambulatory Visit | Attending: Pulmonary Disease | Admitting: Pulmonary Disease

## 2012-09-24 ENCOUNTER — Ambulatory Visit (INDEPENDENT_AMBULATORY_CARE_PROVIDER_SITE_OTHER): Payer: Medicare Other | Admitting: Pulmonary Disease

## 2012-09-24 VITALS — BP 100/70 | HR 94 | Temp 99.0°F | Ht 63.0 in | Wt 144.0 lb

## 2012-09-24 DIAGNOSIS — R0989 Other specified symptoms and signs involving the circulatory and respiratory systems: Secondary | ICD-10-CM

## 2012-09-24 DIAGNOSIS — R0609 Other forms of dyspnea: Secondary | ICD-10-CM | POA: Insufficient documentation

## 2012-09-24 NOTE — Patient Instructions (Addendum)
Will set up for breathing studies in the next few weeks, and will arrange followup with me once available to discuss the results. No change in breathing medications for now. Will check cxr today, and call you with the results.

## 2012-09-24 NOTE — Addendum Note (Signed)
Addended by: Tommie Sams on: 09/24/2012 10:21 AM   Modules accepted: Orders

## 2012-09-24 NOTE — Assessment & Plan Note (Signed)
The patient has significant dyspnea on exertion despite being treated with an aggressive bronchodilator regimen.  It is unclear if the patient has COPD, or to what degree.  I think we need to do full pulmonary function studies to evaluate this.  I have had a long discussion with her about dyspnea, and explained that weight and conditioning, as well as heart issues can also contribute.  We can determine how much her lung disease is contributing to her overall symptoms if she does indeed have COPD.  I have also noted that she is having a lot of upper airway throat clearing and cough, and she is on an ACE inhibitor.  Would consider changing this, but will leave to her primary care physician.

## 2012-09-24 NOTE — Progress Notes (Signed)
  Subjective:    Patient ID: Kelsey Long, female    DOB: November 23, 1946, 66 y.o.   MRN: [id number]  HPI The patient is a 66 year old female who I've been asked to see for dyspnea on exertion.  She tells me that she has had breathing issues for 10 years, and that her symptoms have been progressive.  She has a long history of smoking for 40 years, but has quit approximately 2 years ago.  She tells me that she has been given the diagnosis of COPD, but is unsure if she has ever had pulmonary function studies.  She is currently being treated with Spiriva and symbicort, but continues to have significant dyspnea on exertion.  The patient states that she can get short of breath just walking briskly through her house.  She recently tried to see how far she can walk, and became very short of breath at 100 feet.  She will also get winded bringing groceries in from the car, and sweeping one room of her home.  She has a chronic dry hacking cough that occasionally produces mucus primarily in the mornings.  To her knowledge, she has not had a recent chest x-ray.  Nor has she had a recent cardiac workup, but does not believe that she has any cardiac issues.  The patient has a lot of throat clearing during her visit today, and it should be noted that she is on an ACE inhibitor.   Review of Systems  Constitutional: Negative for fever and unexpected weight change.  HENT: Positive for congestion, rhinorrhea, sneezing, dental problem, postnasal drip and sinus pressure. Negative for ear pain, nosebleeds, sore throat and trouble swallowing.   Eyes: Negative for redness and itching.  Respiratory: Positive for cough ( mornings are the worse), chest tightness, shortness of breath and wheezing.   Cardiovascular: Positive for leg swelling. Negative for palpitations.  Gastrointestinal: Negative for nausea and vomiting.  Genitourinary: Negative for dysuria.  Musculoskeletal: Positive for joint swelling.  Skin: Negative for rash.    Neurological: Positive for headaches.  Hematological: Bruises/bleeds easily.  Psychiatric/Behavioral: Positive for dysphoric mood. The patient is nervous/anxious.        Objective:   Physical Exam Constitutional:  Obese female, no acute distress  HENT:  Nares patent without discharge  Oropharynx without exudate, palate and uvula are normal  Eyes:  Perrla, eomi, no scleral icterus  Neck:  No JVD, no TMG  Cardiovascular:  Normal rate, regular rhythm, no rubs or gallops.  No murmurs        Intact distal pulses  Pulmonary :  Decreased breath sounds, no stridor or respiratory distress   No rales, rhonchi, or wheezing  Abdominal:  Soft, nondistended, bowel sounds present.  No tenderness noted.   Musculoskeletal:  No lower extremity edema noted.  Lymph Nodes:  No cervical lymphadenopathy noted  Skin:  No cyanosis noted  Neurologic:  Alert, appropriate, moves all 4 extremities without obvious deficit.         Assessment & Plan:

## 2012-10-01 ENCOUNTER — Ambulatory Visit (INDEPENDENT_AMBULATORY_CARE_PROVIDER_SITE_OTHER): Payer: Medicare Other | Admitting: Pulmonary Disease

## 2012-10-01 DIAGNOSIS — R0609 Other forms of dyspnea: Secondary | ICD-10-CM

## 2012-10-01 LAB — PULMONARY FUNCTION TEST

## 2012-10-01 NOTE — Progress Notes (Signed)
PFT done today. 

## 2012-10-16 ENCOUNTER — Encounter: Payer: Self-pay | Admitting: Pulmonary Disease

## 2012-10-31 ENCOUNTER — Ambulatory Visit: Payer: Medicare Other | Admitting: Pulmonary Disease

## 2014-02-03 ENCOUNTER — Encounter: Payer: Self-pay | Admitting: Anesthesiology

## 2014-02-21 ENCOUNTER — Encounter: Payer: Self-pay | Admitting: Anesthesiology

## 2014-03-13 ENCOUNTER — Encounter (HOSPITAL_COMMUNITY): Payer: Self-pay | Admitting: Emergency Medicine

## 2014-03-13 ENCOUNTER — Emergency Department (HOSPITAL_COMMUNITY)
Admission: EM | Admit: 2014-03-13 | Discharge: 2014-03-13 | Disposition: A | Discharge status: Home / Self Care | Payer: PRIVATE HEALTH INSURANCE | Attending: Emergency Medicine | Admitting: Emergency Medicine

## 2014-03-13 DIAGNOSIS — Z8542 Personal history of malignant neoplasm of other parts of uterus: Secondary | ICD-10-CM | POA: Insufficient documentation

## 2014-03-13 DIAGNOSIS — J449 Chronic obstructive pulmonary disease, unspecified: Secondary | ICD-10-CM | POA: Insufficient documentation

## 2014-03-13 DIAGNOSIS — J4489 Other specified chronic obstructive pulmonary disease: Secondary | ICD-10-CM | POA: Insufficient documentation

## 2014-03-13 DIAGNOSIS — I10 Essential (primary) hypertension: Secondary | ICD-10-CM | POA: Insufficient documentation

## 2014-03-13 DIAGNOSIS — F411 Generalized anxiety disorder: Secondary | ICD-10-CM | POA: Insufficient documentation

## 2014-03-13 DIAGNOSIS — Z880 Allergy status to penicillin: Secondary | ICD-10-CM | POA: Insufficient documentation

## 2014-03-13 DIAGNOSIS — Z87891 Personal history of nicotine dependence: Secondary | ICD-10-CM | POA: Insufficient documentation

## 2014-03-13 DIAGNOSIS — G8929 Other chronic pain: Secondary | ICD-10-CM | POA: Insufficient documentation

## 2014-03-13 DIAGNOSIS — Z79899 Other long term (current) drug therapy: Secondary | ICD-10-CM | POA: Insufficient documentation

## 2014-03-13 DIAGNOSIS — N390 Urinary tract infection, site not specified: Secondary | ICD-10-CM

## 2014-03-13 LAB — URINALYSIS, ROUTINE W REFLEX MICROSCOPIC
Glucose, UA: NEGATIVE mg/dL
KETONES UR: 40 mg/dL — AB
Nitrite: POSITIVE — AB
Specific Gravity, Urine: 1.021 (ref 1.005–1.030)
pH: 5 (ref 5.0–8.0)

## 2014-03-13 LAB — URINE MICROSCOPIC-ADD ON

## 2014-03-13 MED ORDER — CIPROFLOXACIN HCL 500 MG PO TABS: 500.0000 mg | ORAL_TABLET | Freq: Two times a day (BID) | ORAL | Status: DC

## 2014-03-13 MED ORDER — CIPROFLOXACIN HCL 500 MG PO TABS
500.0000 mg | ORAL_TABLET | Freq: Once | ORAL | Status: AC
  Administered 2014-03-13: 500 mg via ORAL
  Filled 2014-03-13: qty 1

## 2014-03-13 NOTE — ED Provider Notes (Addendum)
CSN: [id number]     Arrival date & time 03/13/14  1212 History   First MD Initiated Contact with Patient 03/13/14 1216     Chief Complaint  Patient presents with  . Hematuria     (Consider location/radiation/quality/duration/timing/severity/associated sxs/prior Treatment) HPI Comments: Denies sx of urinary retention  Patient is a 68 y.o. female presenting with hematuria. The history is provided by the patient.  Hematuria This is a new problem. The current episode started yesterday. The problem occurs constantly. The problem has been gradually worsening. Associated symptoms comments: Suprapubic pain.  No vomiting or fever.  No flank pain. Exacerbated by: urinating. Nothing relieves the symptoms. Treatments tried: azo. The treatment provided no relief.    Past Medical History  Diagnosis Date  . COPD (chronic obstructive pulmonary disease)   . Hypertension   . Chronic pain   . Anxiety   . Uterine cancer    Past Surgical History  Procedure Laterality Date  . Gallbladder surgery    . Neck surgery    . Total abdominal hysterectomy     Family History  Problem Relation Age of Onset  . Emphysema Mother   . Emphysema Maternal Uncle   . Asthma      daughter  . Heart failure Father   . Heart failure Maternal Grandmother   . Cancer Mother    History  Substance Use Topics  . Smoking status: Former Smoker -- 1.00 packs/day for 40 years    Quit date: 12/24/2009  . Smokeless tobacco: Never Used  . Alcohol Use: Yes     Comment: rare   OB History   Grav Para Term Preterm Abortions TAB SAB Ect Mult Living                 Review of Systems  Constitutional: Negative for fever.  Genitourinary: Positive for dysuria, urgency, frequency and hematuria.  All other systems reviewed and are negative.      Allergies  Penicillins; Sulfa drugs cross reactors; and Tetracyclines & related  Home Medications   Current Outpatient Rx  Name  Route  Sig  Dispense  Refill  . albuterol  (PROVENTIL HFA;VENTOLIN HFA) 108 (90 BASE) MCG/ACT inhaler   Inhalation   Inhale 2 puffs into the lungs every 6 (six) hours as needed for wheezing or shortness of breath.          Marland Kitchen atorvastatin (LIPITOR) 20 MG tablet   Oral   Take 20 mg by mouth daily.         . benazepril-hydrochlorthiazide (LOTENSIN HCT) 20-12.5 MG per tablet   Oral   Take 1 tablet by mouth daily.         . budesonide-formoterol (SYMBICORT) 160-4.5 MCG/ACT inhaler   Inhalation   Inhale 2 puffs into the lungs 2 (two) times daily.         Marland Kitchen buPROPion (WELLBUTRIN SR) 150 MG 12 hr tablet   Oral   Take 150 mg by mouth 2 (two) times daily.         . Cholecalciferol (VITAMIN D3) 2000 UNITS TABS   Oral   Take 1 capsule by mouth daily.         . cloNIDine (CATAPRES) 0.1 MG tablet   Oral   Take 0.1 mg by mouth daily.          . DULoxetine (CYMBALTA) 30 MG capsule   Oral   Take 30 mg by mouth 2 (two) times daily.         Marland Kitchen  gabapentin (NEURONTIN) 600 MG tablet   Oral   Take 600 mg by mouth at bedtime.          . metoprolol succinate (TOPROL-XL) 50 MG 24 hr tablet   Oral   Take 50 mg by mouth daily. Take with or immediately following a meal.         . montelukast (SINGULAIR) 10 MG tablet   Oral   Take 10 mg by mouth at bedtime.          Marland Kitchen omeprazole (PRILOSEC) 20 MG capsule   Oral   Take 20 mg by mouth daily.         Marland Kitchen oxyCODONE-acetaminophen (PERCOCET) 10-325 MG per tablet   Oral   Take 1 tablet by mouth every 4 (four) hours as needed for pain.         . potassium chloride (K-DUR) 10 MEQ tablet   Oral   Take 10 mEq by mouth daily.         Marland Kitchen tiotropium (SPIRIVA) 18 MCG inhalation capsule   Inhalation   Place 18 mcg into inhaler and inhale daily.         . Vitamin D, Ergocalciferol, (DRISDOL) 50000 UNITS CAPS capsule   Oral   Take 50,000 Units by mouth every 7 (seven) days.          BP 123/75  Pulse 51  Temp(Src) 98.4 F (36.9 C) (Oral)  Resp 19  SpO2  92% Physical Exam  Nursing note and vitals reviewed. Constitutional: She is oriented to person, place, and time. She appears well-developed and well-nourished. No distress.  HENT:  Head: Normocephalic and atraumatic.  Mouth/Throat: Oropharynx is clear and moist.  Eyes: Conjunctivae and EOM are normal. Pupils are equal, round, and reactive to light.  Neck: Normal range of motion. Neck supple.  Cardiovascular: Normal rate, regular rhythm and intact distal pulses.   No murmur heard. Pulmonary/Chest: Effort normal and breath sounds normal. No respiratory distress. She has no wheezes. She has no rales.  Abdominal: Soft. She exhibits no distension. There is tenderness in the suprapubic area. There is no rebound, no guarding and no CVA tenderness.  Musculoskeletal: Normal range of motion. She exhibits no edema and no tenderness.  Neurological: She is alert and oriented to person, place, and time.  Skin: Skin is warm and dry. No rash noted. No erythema.  Psychiatric: She has a normal mood and affect. Her behavior is normal.    ED Course  Procedures (including critical care time) Labs Review Labs Reviewed  URINALYSIS, ROUTINE W REFLEX MICROSCOPIC - Abnormal; Notable for the following:    Color, Urine RED (*)    APPearance CLOUDY (*)    Hgb urine dipstick MODERATE (*)    Bilirubin Urine LARGE (*)    Ketones, ur 40 (*)    Protein, ur >300 (*)    Urobilinogen, UA >8.0 (*)    Nitrite POSITIVE (*)    Leukocytes, UA MODERATE (*)    All other components within normal limits  URINE MICROSCOPIC-ADD ON - Abnormal; Notable for the following:    Bacteria, UA MANY (*)    All other components within normal limits   Imaging Review No results found.   EKG Interpretation None      MDM   Final diagnoses:  UTI (lower urinary tract infection)   Patient with symptoms consistent with an uncomplicated UTI and hematuria. Denies vomiting, fever or signs of pyelonephritis. Will treat with Cipro and  urine culture done.  Blanchie Dessert, MD 03/13/14 [place], MD 03/13/14 1330

## 2014-03-13 NOTE — ED Notes (Signed)
She c/o hematuria, which began yesterday--"worse today".  She is in no distress and ambulates without difficulty.  She states she has a hx of "cystitis".

## 2014-03-13 NOTE — ED Notes (Signed)
Bed: WA03 Expected date: 03/13/14 Expected time: 11:58 AM Means of arrival: Ambulance Comments: hematuria

## 2014-03-13 NOTE — ED Notes (Signed)
Pt calling for ride home. In no acute distress.

## 2014-03-13 NOTE — ED Notes (Signed)
Pt offered sprite and crackers per verbal order from MD.  No evidence of emesis.  Pt states no food since lunchtime yesterday due to falling asleep.

## 2014-03-15 LAB — URINE CULTURE

## 2014-03-24 ENCOUNTER — Encounter: Payer: Self-pay | Admitting: Anesthesiology

## 2014-08-17 ENCOUNTER — Other Ambulatory Visit: Payer: Self-pay

## 2014-08-17 DIAGNOSIS — Z1231 Encounter for screening mammogram for malignant neoplasm of breast: Secondary | ICD-10-CM

## 2014-09-02 ENCOUNTER — Ambulatory Visit: Payer: Medicare Other

## 2014-09-09 ENCOUNTER — Ambulatory Visit
Admission: RE | Admit: 2014-09-09 | Discharge: 2014-09-09 | Disposition: A | Discharge status: Home / Self Care | Payer: PRIVATE HEALTH INSURANCE | Source: Ambulatory Visit

## 2014-09-09 DIAGNOSIS — Z1231 Encounter for screening mammogram for malignant neoplasm of breast: Secondary | ICD-10-CM

## 2014-09-13 ENCOUNTER — Other Ambulatory Visit: Payer: Self-pay | Admitting: Family Medicine

## 2014-09-13 DIAGNOSIS — R928 Other abnormal and inconclusive findings on diagnostic imaging of breast: Secondary | ICD-10-CM

## 2014-09-23 ENCOUNTER — Other Ambulatory Visit: Payer: PRIVATE HEALTH INSURANCE

## 2014-11-11 ENCOUNTER — Ambulatory Visit
Admission: RE | Admit: 2014-11-11 | Discharge: 2014-11-11 | Disposition: A | Discharge status: Home / Self Care | Payer: PRIVATE HEALTH INSURANCE | Source: Ambulatory Visit | Attending: Family Medicine | Admitting: Family Medicine

## 2014-11-11 DIAGNOSIS — R928 Other abnormal and inconclusive findings on diagnostic imaging of breast: Secondary | ICD-10-CM

## 2014-12-14 ENCOUNTER — Emergency Department: Payer: Self-pay | Admitting: Emergency Medicine

## 2015-05-02 ENCOUNTER — Emergency Department: Payer: Medicare Other

## 2015-05-02 ENCOUNTER — Emergency Department
Admission: EM | Admit: 2015-05-02 | Discharge: 2015-05-03 | Disposition: A | Discharge status: Home / Self Care | Payer: Medicare Other | Attending: Emergency Medicine | Admitting: Emergency Medicine

## 2015-05-02 DIAGNOSIS — Z79899 Other long term (current) drug therapy: Secondary | ICD-10-CM | POA: Insufficient documentation

## 2015-05-02 DIAGNOSIS — J40 Bronchitis, not specified as acute or chronic: Secondary | ICD-10-CM

## 2015-05-02 DIAGNOSIS — Z87891 Personal history of nicotine dependence: Secondary | ICD-10-CM | POA: Diagnosis not present

## 2015-05-02 DIAGNOSIS — I10 Essential (primary) hypertension: Secondary | ICD-10-CM | POA: Insufficient documentation

## 2015-05-02 DIAGNOSIS — J449 Chronic obstructive pulmonary disease, unspecified: Secondary | ICD-10-CM | POA: Insufficient documentation

## 2015-05-02 DIAGNOSIS — Z880 Allergy status to penicillin: Secondary | ICD-10-CM | POA: Diagnosis not present

## 2015-05-02 DIAGNOSIS — R509 Fever, unspecified: Secondary | ICD-10-CM | POA: Diagnosis present

## 2015-05-02 DIAGNOSIS — Z7951 Long term (current) use of inhaled steroids: Secondary | ICD-10-CM | POA: Diagnosis not present

## 2015-05-02 MED ORDER — IPRATROPIUM-ALBUTEROL 0.5-2.5 (3) MG/3ML IN SOLN
RESPIRATORY_TRACT | Status: AC
  Administered 2015-05-02: 3 mL via RESPIRATORY_TRACT
  Filled 2015-05-02: qty 3

## 2015-05-02 MED ORDER — BENZONATATE 100 MG PO CAPS: 100.0000 mg | ORAL_CAPSULE | Freq: Three times a day (TID) | ORAL | Status: AC | PRN

## 2015-05-02 MED ORDER — ALBUTEROL SULFATE (2.5 MG/3ML) 0.083% IN NEBU: 2.5000 mg | INHALATION_SOLUTION | Freq: Four times a day (QID) | RESPIRATORY_TRACT | Status: AC | PRN

## 2015-05-02 MED ORDER — DEXAMETHASONE SODIUM PHOSPHATE 10 MG/ML IJ SOLN
INTRAMUSCULAR | Status: AC
  Administered 2015-05-02: 10 mg via INTRAMUSCULAR
  Filled 2015-05-02: qty 1

## 2015-05-02 MED ORDER — PREDNISONE 20 MG PO TABS: 40.0000 mg | ORAL_TABLET | Freq: Every day | ORAL | Status: DC

## 2015-05-02 MED ORDER — AZITHROMYCIN 250 MG PO TABS
ORAL_TABLET | ORAL | Status: AC
  Administered 2015-05-02: 500 mg via ORAL
  Filled 2015-05-02: qty 2

## 2015-05-02 MED ORDER — DEXAMETHASONE SODIUM PHOSPHATE 10 MG/ML IJ SOLN
10.0000 mg | Freq: Once | INTRAMUSCULAR | Status: AC
  Administered 2015-05-02: 10 mg via INTRAMUSCULAR

## 2015-05-02 MED ORDER — AZITHROMYCIN 250 MG PO TABS
500.0000 mg | ORAL_TABLET | Freq: Once | ORAL | Status: AC
  Administered 2015-05-02: 500 mg via ORAL

## 2015-05-02 MED ORDER — IPRATROPIUM-ALBUTEROL 0.5-2.5 (3) MG/3ML IN SOLN
3.0000 mL | Freq: Once | RESPIRATORY_TRACT | Status: AC
  Administered 2015-05-02: 3 mL via RESPIRATORY_TRACT

## 2015-05-02 MED ORDER — AZITHROMYCIN 250 MG PO TABS: ORAL_TABLET | ORAL | Status: AC

## 2015-05-02 NOTE — ED Notes (Signed)
Pt denies cp or any worsened sob then base line.

## 2015-05-02 NOTE — ED Provider Notes (Signed)
[facility] Emergency Department Provider Note  ____________________________________________  Time seen: Approximately 2130  I have reviewed the triage vital signs and the nursing notes.   HISTORY  Chief Complaint Cough and Fever    HPI Kelsey Long is a 69 y.o. female with history of recent problems has been using family members nebs is concerned that she may have developed pneumonia she has a productive cough described a subjective fever with chills nothing seemed to be making it better or worse denies nausea vomiting or any other abnormalities as of note   Past Medical History  Diagnosis Date  . COPD (chronic obstructive pulmonary disease)   . Hypertension   . Chronic pain   . Anxiety   . Uterine cancer     Patient Active Problem List   Diagnosis Date Noted  . DOE (dyspnea on exertion) 09/24/2012    Past Surgical History  Procedure Laterality Date  . Gallbladder surgery    . Neck surgery    . Total abdominal hysterectomy      Current Outpatient Rx  Name  Route  Sig  Dispense  Refill  . albuterol (PROVENTIL HFA;VENTOLIN HFA) 108 (90 BASE) MCG/ACT inhaler   Inhalation   Inhale 2 puffs into the lungs every 6 (six) hours as needed for wheezing or shortness of breath.          Marland Kitchen albuterol (PROVENTIL) (2.5 MG/3ML) 0.083% nebulizer solution   Nebulization   Take 3 mLs (2.5 mg total) by nebulization every 6 (six) hours as needed for wheezing or shortness of breath.   75 mL   2   . atorvastatin (LIPITOR) 20 MG tablet   Oral   Take 20 mg by mouth daily.         Marland Kitchen azithromycin (ZITHROMAX Z-PAK) 250 MG tablet      followed by 1 tablet (250 mg) once daily on Days 2 through 5.   4 each   0   . benazepril-hydrochlorthiazide (LOTENSIN HCT) 20-12.5 MG per tablet   Oral   Take 1 tablet by mouth daily.         . benzonatate (TESSALON PERLES) 100 MG capsule   Oral   Take 1 capsule (100 mg total) by mouth 3 (three) times daily as  needed for cough.   15 capsule   0   . budesonide-formoterol (SYMBICORT) 160-4.5 MCG/ACT inhaler   Inhalation   Inhale 2 puffs into the lungs 2 (two) times daily.         Marland Kitchen buPROPion (WELLBUTRIN SR) 150 MG 12 hr tablet   Oral   Take 150 mg by mouth 2 (two) times daily.         . Cholecalciferol (VITAMIN D3) 2000 UNITS TABS   Oral   Take 1 capsule by mouth daily.         . ciprofloxacin (CIPRO) 500 MG tablet   Oral   Take 1 tablet (500 mg total) by mouth every 12 (twelve) hours.   14 tablet   0   . cloNIDine (CATAPRES) 0.1 MG tablet   Oral   Take 0.1 mg by mouth daily.          . DULoxetine (CYMBALTA) 30 MG capsule   Oral   Take 30 mg by mouth 2 (two) times daily.         Marland Kitchen gabapentin (NEURONTIN) 600 MG tablet   Oral   Take 600 mg by mouth at bedtime.          Marland Kitchen  metoprolol succinate (TOPROL-XL) 50 MG 24 hr tablet   Oral   Take 50 mg by mouth daily. Take with or immediately following a meal.         . montelukast (SINGULAIR) 10 MG tablet   Oral   Take 10 mg by mouth at bedtime.          Marland Kitchen omeprazole (PRILOSEC) 20 MG capsule   Oral   Take 20 mg by mouth daily.         Marland Kitchen oxyCODONE-acetaminophen (PERCOCET) 10-325 MG per tablet   Oral   Take 1 tablet by mouth every 4 (four) hours as needed for pain.         . potassium chloride (K-DUR) 10 MEQ tablet   Oral   Take 10 mEq by mouth daily.         . predniSONE (DELTASONE) 20 MG tablet   Oral   Take 2 tablets (40 mg total) by mouth daily.   10 tablet   0   . tiotropium (SPIRIVA) 18 MCG inhalation capsule   Inhalation   Place 18 mcg into inhaler and inhale daily.         . Vitamin D, Ergocalciferol, (DRISDOL) 50000 UNITS CAPS capsule   Oral   Take 50,000 Units by mouth every 7 (seven) days.           Allergies Penicillins; Sulfa drugs cross reactors; and Tetracyclines & related  Family History  Problem Relation Age of Onset  . Emphysema Mother   . Emphysema Maternal Uncle   .  Asthma      daughter  . Heart failure Father   . Heart failure Maternal Grandmother   . Cancer Mother     Social History History  Substance Use Topics  . Smoking status: Former Smoker -- 1.00 packs/day for 40 years    Quit date: 12/24/2009  . Smokeless tobacco: Never Used  . Alcohol Use: Yes     Comment: rare    Review of Systems Constitutional: No fever/chills Eyes: No visual changes. ENT: No sore throat. Cardiovascular: Denies chest pain. Respiratory: Denies shortness of breath. Gastrointestinal: No abdominal pain.  No nausea, no vomiting.  No diarrhea.  No constipation. Genitourinary: Negative for dysuria. Musculoskeletal: Negative for back pain. Skin: Negative for rash. Neurological: Negative for headaches, focal weakness or numbness.  10-point ROS otherwise negative.  ____________________________________________   PHYSICAL EXAM:  VITAL SIGNS: ED Triage Vitals  Enc Vitals Group     BP 05/02/15 2035 124/97 mmHg     Pulse Rate 05/02/15 2035 96     Resp 05/02/15 2035 18     Temp 05/02/15 2035 98.3 F (36.8 C)     Temp Source 05/02/15 2035 Oral     SpO2 05/02/15 2035 93 %     Weight 05/02/15 2035 130 lb (58.968 kg)     Height 05/02/15 2035 5\' 2"  (1.575 m)     Head Cir --      Peak Flow --      Pain Score 05/02/15 2113 5     Pain Loc --      Pain Edu? --      Excl. in [place]? --     Constitutional: Alert and oriented. Well appearing and in no acute distress. Eyes: Conjunctivae are normal. PERRL. EOMI. Head: Atraumatic. Nose: No congestion/rhinnorhea. Mouth/Throat: Mucous membranes are moist.  Oropharynx non-erythematous. Neck: No stridor. Cardiovascular: Normal rate, regular rhythm. Grossly normal heart sounds.  Good peripheral circulation. Respiratory: Normal respiratory  effort.  Wheezing throughout all fields bilaterally no retraction Musculoskeletal: No lower extremity tenderness nor edema.  No joint effusions. Neurologic:  Normal speech and language. No  gross focal neurologic deficits are appreciated. Speech is normal. No gait instability. Skin:  Skin is warm, dry and intact. No rash noted. Psychiatric: Mood and affect are normal. Speech and behavior are normal.  ____________________________________________    RADIOLOGY  Bibasilar atelectasis ____________________________________________   PROCEDURES  Procedure(s) performed: None  Critical Care performed: No  ____________________________________________   INITIAL IMPRESSION / ASSESSMENT AND PLAN / ED COURSE  Pertinent labs & imaging results that were available during my care of the patient were reviewed by me and considered in my medical decision making (see chart for details).  Initial impression on this patient as bronchitis and start the patient on steroids nebs and antibiotics have a follow-up with her doctor in 2-3 days if not improving ____________________________________________   FINAL CLINICAL IMPRESSION(S) / ED DIAGNOSES  Final diagnoses:  Bronchitis     Kemiah Booz Verdene Rio, PA-C 05/02/15 2317  Delman Kitten, MD 05/07/15 [phone]

## 2015-05-02 NOTE — ED Notes (Signed)
Transported to xray 

## 2015-05-02 NOTE — ED Notes (Signed)
Pt states fever and cough x 1 week. Pt states she has been using family members inhalers to help with her breathing. Pt has had a sick grandchild in the home also with cough.

## 2015-05-09 ENCOUNTER — Emergency Department (HOSPITAL_COMMUNITY)
Admission: EM | Admit: 2015-05-09 | Discharge: 2015-05-09 | Disposition: A | Discharge status: Home / Self Care | Payer: Medicare Other | Attending: Emergency Medicine | Admitting: Emergency Medicine

## 2015-05-09 ENCOUNTER — Encounter (HOSPITAL_COMMUNITY): Payer: Self-pay | Admitting: *Deleted

## 2015-05-09 ENCOUNTER — Emergency Department (HOSPITAL_COMMUNITY): Payer: Medicare Other

## 2015-05-09 DIAGNOSIS — Z79899 Other long term (current) drug therapy: Secondary | ICD-10-CM | POA: Insufficient documentation

## 2015-05-09 DIAGNOSIS — G8929 Other chronic pain: Secondary | ICD-10-CM | POA: Insufficient documentation

## 2015-05-09 DIAGNOSIS — Z792 Long term (current) use of antibiotics: Secondary | ICD-10-CM | POA: Insufficient documentation

## 2015-05-09 DIAGNOSIS — Z87891 Personal history of nicotine dependence: Secondary | ICD-10-CM | POA: Diagnosis not present

## 2015-05-09 DIAGNOSIS — Z880 Allergy status to penicillin: Secondary | ICD-10-CM | POA: Insufficient documentation

## 2015-05-09 DIAGNOSIS — R0602 Shortness of breath: Secondary | ICD-10-CM | POA: Diagnosis present

## 2015-05-09 DIAGNOSIS — J441 Chronic obstructive pulmonary disease with (acute) exacerbation: Secondary | ICD-10-CM | POA: Diagnosis not present

## 2015-05-09 DIAGNOSIS — I10 Essential (primary) hypertension: Secondary | ICD-10-CM | POA: Insufficient documentation

## 2015-05-09 DIAGNOSIS — Z8542 Personal history of malignant neoplasm of other parts of uterus: Secondary | ICD-10-CM | POA: Diagnosis not present

## 2015-05-09 DIAGNOSIS — F419 Anxiety disorder, unspecified: Secondary | ICD-10-CM | POA: Diagnosis not present

## 2015-05-09 DIAGNOSIS — J40 Bronchitis, not specified as acute or chronic: Secondary | ICD-10-CM

## 2015-05-09 LAB — CBC
HCT: 36.4 % (ref 36.0–46.0)
HEMOGLOBIN: 12.3 g/dL (ref 12.0–15.0)
MCH: 28.5 pg (ref 26.0–34.0)
MCHC: 33.8 g/dL (ref 30.0–36.0)
MCV: 84.3 fL (ref 78.0–100.0)
Platelets: 513 10*3/uL — ABNORMAL HIGH (ref 150–400)
RBC: 4.32 MIL/uL (ref 3.87–5.11)
RDW: 13.5 % (ref 11.5–15.5)
WBC: 24.2 10*3/uL — ABNORMAL HIGH (ref 4.0–10.5)

## 2015-05-09 LAB — COMPREHENSIVE METABOLIC PANEL
ALT: 17 U/L (ref 14–54)
AST: 23 U/L (ref 15–41)
Albumin: 3.1 g/dL — ABNORMAL LOW (ref 3.5–5.0)
Alkaline Phosphatase: 69 U/L (ref 38–126)
Anion gap: 10 (ref 5–15)
BUN: 10 mg/dL (ref 6–20)
CALCIUM: 9.2 mg/dL (ref 8.9–10.3)
CO2: 30 mmol/L (ref 22–32)
Chloride: 90 mmol/L — ABNORMAL LOW (ref 101–111)
Creatinine, Ser: 1.04 mg/dL — ABNORMAL HIGH (ref 0.44–1.00)
GFR calc Af Amer: >60 mL/min (ref >60)
GFR, EST NON AFRICAN AMERICAN: 55 mL/min — AB (ref >60)
GLUCOSE: 123 mg/dL — AB (ref 65–99)
Potassium: 3.4 mmol/L — ABNORMAL LOW (ref 3.5–5.1)
Sodium: 130 mmol/L — ABNORMAL LOW (ref 135–145)
TOTAL PROTEIN: 6 g/dL — AB (ref 6.5–8.1)
Total Bilirubin: 0.6 mg/dL (ref 0.3–1.2)

## 2015-05-09 NOTE — ED Provider Notes (Signed)
CSN: [id number]     Arrival date & time 05/09/15  6712 History   First MD Initiated Contact with Patient 05/09/15 [phone]     Chief Complaint  Patient presents with  . Shortness of Breath  . Cough     (Consider location/radiation/quality/duration/timing/severity/associated sxs/prior Treatment) HPI Kelsey Long is a 69 y.o female with a history of COPD, HTN, anxiety, and uterine cancer who presents for productive, green sputum cough and shortness of breath for the past 10 days.  Kelsey Long states Kelsey Long has also had a low grade fever.  Kelsey Long was seen for the same on 5/9 and states Kelsey Long was prescribed zyrtec and prednisone which did not help.  Kelsey Long has been using the nebulizer twice a day and her rescue inhaler a couple times a day.  Kelsey Long states that he granddaughter and son are sick with pneumonia and is worried that Kelsey Long may have it also.  Kelsey Long denies any chest pain, abdominal pain, nausea, vomiting, or leg swelling.  Past Medical History  Diagnosis Date  . COPD (chronic obstructive pulmonary disease)   . Hypertension   . Chronic pain   . Anxiety   . Uterine cancer    Past Surgical History  Procedure Laterality Date  . Gallbladder surgery    . Neck surgery    . Total abdominal hysterectomy     Family History  Problem Relation Age of Onset  . Emphysema Mother   . Emphysema Maternal Uncle   . Asthma      daughter  . Heart failure Father   . Heart failure Maternal Grandmother   . Cancer Mother    History  Substance Use Topics  . Smoking status: Former Smoker -- 1.00 packs/day for 40 years    Quit date: 12/24/2009  . Smokeless tobacco: Never Used  . Alcohol Use: Yes     Comment: rare   OB History    No data available     Review of Systems  Constitutional: Positive for fever.  Respiratory: Positive for cough and shortness of breath. Negative for chest tightness and wheezing.   Cardiovascular: Negative for chest pain and leg swelling.  All other systems reviewed and are  negative.     Allergies  Penicillins; Sulfa drugs cross reactors; and Tetracyclines & related  Home Medications   Prior to Admission medications   Medication Sig Start Date End Date Taking? Authorizing Provider  albuterol (PROVENTIL HFA;VENTOLIN HFA) 108 (90 BASE) MCG/ACT inhaler Inhale 2 puffs into the lungs every 6 (six) hours as needed for wheezing or shortness of breath.    Yes Historical Provider, MD  albuterol (PROVENTIL) (2.5 MG/3ML) 0.083% nebulizer solution Take 3 mLs (2.5 mg total) by nebulization every 6 (six) hours as needed for wheezing or shortness of breath. 05/02/15  Yes III Luanna Cole Ruffian, PA-C  alendronate (FOSAMAX) 70 MG tablet Take 70 mg by mouth once a week. 04/30/15  Yes Historical Provider, MD  atorvastatin (LIPITOR) 20 MG tablet Take 20 mg by mouth daily.   Yes Historical Provider, MD  benazepril-hydrochlorthiazide (LOTENSIN HCT) 20-12.5 MG per tablet Take 1 tablet by mouth daily.   Yes Historical Provider, MD  benzonatate (TESSALON PERLES) 100 MG capsule Take 1 capsule (100 mg total) by mouth 3 (three) times daily as needed for cough. 05/02/15 05/01/16 Yes III Luanna Cole Ruffian, PA-C  budesonide-formoterol (SYMBICORT) 160-4.5 MCG/ACT inhaler Inhale 2 puffs into the lungs 2 (two) times daily.   Yes Historical Provider, MD  buPROPion (WELLBUTRIN SR) 150 MG 12  hr tablet Take 150 mg by mouth 2 (two) times daily.   Yes Historical Provider, MD  Cholecalciferol (VITAMIN D3) 2000 UNITS TABS Take 1 capsule by mouth daily.   Yes Historical Provider, MD  cloNIDine (CATAPRES) 0.1 MG tablet Take 0.1 mg by mouth daily.    Yes Historical Provider, MD  DULoxetine (CYMBALTA) 30 MG capsule Take 30 mg by mouth 2 (two) times daily.   Yes Historical Provider, MD  gabapentin (NEURONTIN) 600 MG tablet Take 600 mg by mouth at bedtime.    Yes Historical Provider, MD  metoprolol succinate (TOPROL-XL) 50 MG 24 hr tablet Take 50 mg by mouth daily. Take with or immediately following a meal.   Yes  Historical Provider, MD  montelukast (SINGULAIR) 10 MG tablet Take 10 mg by mouth at bedtime.  09/19/12  Yes Historical Provider, MD  omeprazole (PRILOSEC) 20 MG capsule Take 20 mg by mouth daily.   Yes Historical Provider, MD  oxyCODONE-acetaminophen (PERCOCET) 10-325 MG per tablet Take 1 tablet by mouth every 4 (four) hours as needed for pain.   Yes Historical Provider, MD  potassium chloride (K-DUR) 10 MEQ tablet Take 10 mEq by mouth daily.   Yes Historical Provider, MD  tiotropium (SPIRIVA) 18 MCG inhalation capsule Place 18 mcg into inhaler and inhale daily.   Yes Historical Provider, MD  Vitamin D, Ergocalciferol, (DRISDOL) 50000 UNITS CAPS capsule Take 50,000 Units by mouth every 7 (seven) days.   Yes Historical Provider, MD  ciprofloxacin (CIPRO) 500 MG tablet Take 1 tablet (500 mg total) by mouth every 12 (twelve) hours. Patient not taking: Reported on 05/09/2015 03/13/14   Blanchie Dessert, MD  predniSONE (DELTASONE) 20 MG tablet Take 2 tablets (40 mg total) by mouth daily. Patient not taking: Reported on 05/09/2015 05/02/15 05/01/16  III William C Ruffian, PA-C   BP 140/71 mmHg  Pulse 81  Temp(Src) 98.1 F (36.7 C) (Oral)  Resp 17  Wt 130 lb (58.968 kg)  SpO2 93% Physical Exam  Constitutional: Kelsey Long is oriented to person, place, and time. Kelsey Long appears well-developed and well-nourished.  HENT:  Head: Normocephalic and atraumatic.  Eyes: Conjunctivae are normal.  Neck: Normal range of motion. Neck supple.  Cardiovascular: Normal rate, regular rhythm and normal heart sounds.   Pulmonary/Chest: Effort normal and breath sounds normal. No respiratory distress. Kelsey Long has no wheezes. Kelsey Long has no rales. Kelsey Long exhibits no tenderness.  Abdominal: Soft. There is no tenderness.  Musculoskeletal: Normal range of motion.  Neurological: Kelsey Long is alert and oriented to person, place, and time.  Skin: Skin is warm and dry.  Nursing note and vitals reviewed.   ED Course  Procedures (including critical care  time) Labs Review Labs Reviewed  CBC - Abnormal; Notable for the following:    WBC 24.2 (*)    Platelets 513 (*)    All other components within normal limits  COMPREHENSIVE METABOLIC PANEL - Abnormal; Notable for the following:    Sodium 130 (*)    Potassium 3.4 (*)    Chloride 90 (*)    Glucose, Bld 123 (*)    Creatinine, Ser 1.04 (*)    Total Protein 6.0 (*)    Albumin 3.1 (*)    GFR calc non Af Amer 55 (*)    All other components within normal limits    Imaging Review Dg Chest 2 View  05/09/2015   CLINICAL DATA:  Shortness of breath, cough, congestion for the last 2 weeks. Bronchitis.  EXAM: CHEST  2 VIEW  COMPARISON:  05/02/2015  FINDINGS: There is hyperinflation of the lungs compatible with COPD. Calcified granuloma in the right upper lobe. No confluent opacities or effusions. No acute bony abnormality.  IMPRESSION: COPD/chronic changes.  No active disease.   Electronically Signed   By: Rolm Baptise M.D.   On: 05/09/2015 10:11     EKG Interpretation   Date/Time:  Monday May 09 2015 09:42:59 EDT Ventricular Rate:  82 PR Interval:  180 QRS Duration: 88 QT Interval:  378 QTC Calculation: 441 R Axis:   85 Text Interpretation:  Sinus rhythm Anteroseptal infarct, age indeterminate  No significant change since last tracing Confirmed by Winfred Leeds  MD, SAM  [phone]) on 05/09/2015 9:58:37 AM      MDM   Final diagnoses:  Bronchitis   Patient presents for cough and shortness of breath x 10 days.  Kelsey Long was seen on 5/9 for the same and given prednisone and zithromycin with minimal relief.  Worried Kelsey Long has pneumonia.  Patient refused neb treatment and states Kelsey Long does not feel that Kelsey Long would benefit from this.  Her CXR shows hyperinflated lungs but no pneumonia or pneumothorax. Kelsey Long is afebrile and in no acute distress.  Her physical exam is normal. Kelsey Long is on Singulair and has an albuterol inhaler at home.  Kelsey Long would not benefit from additional antibiotics.  Kelsey Long has Robitussin DM  for cough.  I discussed this patient with Dr. Winfred Leeds who has seen the patient.  Kelsey Long does not appear hypoxic or tachypnic.  Her oxygen is 93-94% on room air which Kelsey Long is her baseline.  Kelsey Long says her doctor is aware and Kelsey Long refused to be put on oxygen when he suggested it.  I discussed that the bronchitis can take several weeks to clear up and that Kelsey Long would not benefit from antibiotics.  Kelsey Long can take her albuterol inhaler every 4 hours as needed. Kelsey Long can follow up with her pcp and agrees with the plan.     Ottie Glazier, PA-C 05/09/15 [place], MD 05/09/15 [phone]

## 2015-05-09 NOTE — ED Notes (Signed)
MD J at bedside

## 2015-05-09 NOTE — ED Notes (Signed)
Pt in c/o cough and congestion for the last two weeks, seen at [facility] recently for same and dx with bronchitis, states symptoms are not improving, unsure of fever, cough is productive with green sputum, use of home breathing treatments continues at 1 every 6 hours, last use was last night, multiple family members at home sick with similar symptoms, speaking in full sentences, no distress noted, actively coughing during triage

## 2015-05-09 NOTE — Discharge Instructions (Signed)
TAKE YOUR ALBUTEROL INHALER AS NEEDED EVERY 4 HOURS. FOLLOW UP WITH YOUR PRIMARY CARE PHYSICIAN.

## 2015-05-09 NOTE — ED Provider Notes (Signed)
Patient complains of cough productive of greenish sputum for 1.5-2 weeks. Not improved. Has been treated with azithromycin and with prednisone, without relief. Maximum temperature 100. No vomiting. On exam no distress, speaks in paragraphs lungs clear to auscultation. Coughing occasionally. Heart regular rate and rhythm. Chest x-ray viewed by me. Patient not likely to benefit from further antibiotics or steroids. Suggest albuterol every 4 hours when necessary Chest x-ray viewed by me  Orlie Dakin, MD 05/09/15 1124

## 2015-12-05 ENCOUNTER — Encounter: Payer: Self-pay | Admitting: Emergency Medicine

## 2015-12-05 ENCOUNTER — Emergency Department
Admission: EM | Admit: 2015-12-05 | Discharge: 2015-12-05 | Disposition: A | Discharge status: Home / Self Care | Payer: Medicare Other | Attending: Emergency Medicine | Admitting: Emergency Medicine

## 2015-12-05 ENCOUNTER — Emergency Department: Payer: Medicare Other

## 2015-12-05 DIAGNOSIS — K029 Dental caries, unspecified: Secondary | ICD-10-CM | POA: Diagnosis not present

## 2015-12-05 DIAGNOSIS — Z7951 Long term (current) use of inhaled steroids: Secondary | ICD-10-CM | POA: Diagnosis not present

## 2015-12-05 DIAGNOSIS — H70011 Subperiosteal abscess of mastoid, right ear: Secondary | ICD-10-CM | POA: Diagnosis not present

## 2015-12-05 DIAGNOSIS — I10 Essential (primary) hypertension: Secondary | ICD-10-CM | POA: Diagnosis not present

## 2015-12-05 DIAGNOSIS — Z87891 Personal history of nicotine dependence: Secondary | ICD-10-CM | POA: Diagnosis not present

## 2015-12-05 DIAGNOSIS — Z880 Allergy status to penicillin: Secondary | ICD-10-CM | POA: Insufficient documentation

## 2015-12-05 DIAGNOSIS — K0889 Other specified disorders of teeth and supporting structures: Secondary | ICD-10-CM | POA: Diagnosis present

## 2015-12-05 DIAGNOSIS — K047 Periapical abscess without sinus: Secondary | ICD-10-CM | POA: Diagnosis not present

## 2015-12-05 DIAGNOSIS — Z79899 Other long term (current) drug therapy: Secondary | ICD-10-CM | POA: Diagnosis not present

## 2015-12-05 LAB — CBC WITH DIFFERENTIAL/PLATELET
Basophils Absolute: 0.1 K/uL (ref 0–0.1)
Basophils Relative: 1 %
Eosinophils Absolute: 0.3 K/uL (ref 0–0.7)
Eosinophils Relative: 2 %
HCT: 38.7 % (ref 35.0–47.0)
Hemoglobin: 12.5 g/dL (ref 12.0–16.0)
Lymphocytes Relative: 13 %
Lymphs Abs: 2.4 K/uL (ref 1.0–3.6)
MCH: 27.4 pg (ref 26.0–34.0)
MCHC: 32.3 g/dL (ref 32.0–36.0)
MCV: 84.8 fL (ref 80.0–100.0)
Monocytes Absolute: 1.8 K/uL — ABNORMAL HIGH (ref 0.2–0.9)
Monocytes Relative: 9 %
Neutro Abs: 14.4 K/uL — ABNORMAL HIGH (ref 1.4–6.5)
Neutrophils Relative %: 75 %
Platelets: 476 K/uL — ABNORMAL HIGH (ref 150–440)
RBC: 4.56 MIL/uL (ref 3.80–5.20)
RDW: 13.8 % (ref 11.5–14.5)
WBC: 19 K/uL — ABNORMAL HIGH (ref 3.6–11.0)

## 2015-12-05 LAB — COMPREHENSIVE METABOLIC PANEL WITH GFR
ALT: 15 U/L (ref 14–54)
AST: 22 U/L (ref 15–41)
Albumin: 4 g/dL (ref 3.5–5.0)
Alkaline Phosphatase: 90 U/L (ref 38–126)
Anion gap: 8 (ref 5–15)
BUN: 11 mg/dL (ref 6–20)
CO2: 30 mmol/L (ref 22–32)
Calcium: 9.2 mg/dL (ref 8.9–10.3)
Chloride: 92 mmol/L — ABNORMAL LOW (ref 101–111)
Creatinine, Ser: 0.92 mg/dL (ref 0.44–1.00)
GFR calc Af Amer: >60 mL/min (ref >60)
GFR calc non Af Amer: >60 mL/min (ref >60)
Glucose, Bld: 123 mg/dL — ABNORMAL HIGH (ref 65–99)
Potassium: 3.6 mmol/L (ref 3.5–5.1)
Sodium: 130 mmol/L — ABNORMAL LOW (ref 135–145)
Total Bilirubin: 0.8 mg/dL (ref 0.3–1.2)
Total Protein: 7.3 g/dL (ref 6.5–8.1)

## 2015-12-05 MED ORDER — IOHEXOL 300 MG/ML  SOLN
100.0000 mL | Freq: Once | INTRAMUSCULAR | Status: AC | PRN
  Administered 2015-12-05: 100 mL via INTRAVENOUS
  Filled 2015-12-05: qty 100

## 2015-12-05 MED ORDER — CLINDAMYCIN PHOSPHATE 600 MG/50ML IV SOLN
600.0000 mg | Freq: Once | INTRAVENOUS | Status: AC
  Administered 2015-12-05: 600 mg via INTRAVENOUS
  Filled 2015-12-05: qty 50

## 2015-12-05 MED ORDER — OXYCODONE-ACETAMINOPHEN 5-325 MG PO TABS: 1.0000 | ORAL_TABLET | ORAL | Status: DC | PRN

## 2015-12-05 MED ORDER — CLINDAMYCIN HCL 300 MG PO CAPS: 300.0000 mg | ORAL_CAPSULE | Freq: Four times a day (QID) | ORAL | Status: DC

## 2015-12-05 MED ORDER — ONDANSETRON HCL 4 MG/2ML IJ SOLN
4.0000 mg | Freq: Once | INTRAMUSCULAR | Status: AC
  Administered 2015-12-05: 4 mg via INTRAVENOUS

## 2015-12-05 MED ORDER — HYDROMORPHONE HCL 1 MG/ML IJ SOLN
0.5000 mg | Freq: Once | INTRAMUSCULAR | Status: AC
  Administered 2015-12-05: 0.5 mg via INTRAVENOUS
  Filled 2015-12-05: qty 1

## 2015-12-05 MED ORDER — OXYCODONE-ACETAMINOPHEN 5-325 MG PO TABS
2.0000 | ORAL_TABLET | Freq: Once | ORAL | Status: AC
  Administered 2015-12-05: 2 via ORAL
  Filled 2015-12-05: qty 2

## 2015-12-05 MED ORDER — ONDANSETRON HCL 4 MG/2ML IJ SOLN
INTRAMUSCULAR | Status: AC
  Filled 2015-12-05: qty 2

## 2015-12-05 NOTE — ED Notes (Signed)
Says has bad tooth, but worse for 2 days.  Says right jaw is swlling and could not sleep last night due to pain

## 2015-12-05 NOTE — ED Provider Notes (Signed)
[facility] Emergency Department Provider Note  ____________________________________________  Time seen: Approximately 2:14 PM  I have reviewed the triage vital signs and the nursing notes.   HISTORY  Chief Complaint Jaw Pain and Dental Pain   HPI Kelsey Long is a 69 y.o. female presents for evaluation of a really bad tooth on the right side. Patient reports it started about 2 days ago and has got her whole jaw swollen.   Past Medical History  Diagnosis Date  . COPD (chronic obstructive pulmonary disease) ([place])   . Hypertension   . Chronic pain   . Anxiety   . Uterine cancer [facility])     Patient Active Problem List   Diagnosis Date Noted  . DOE (dyspnea on exertion) 09/24/2012    Past Surgical History  Procedure Laterality Date  . Gallbladder surgery    . Neck surgery    . Total abdominal hysterectomy      Current Outpatient Rx  Name  Route  Sig  Dispense  Refill  . albuterol (PROVENTIL HFA;VENTOLIN HFA) 108 (90 BASE) MCG/ACT inhaler   Inhalation   Inhale 2 puffs into the lungs every 6 (six) hours as needed for wheezing or shortness of breath.          Marland Kitchen albuterol (PROVENTIL) (2.5 MG/3ML) 0.083% nebulizer solution   Nebulization   Take 3 mLs (2.5 mg total) by nebulization every 6 (six) hours as needed for wheezing or shortness of breath.   75 mL   2   . alendronate (FOSAMAX) 70 MG tablet   Oral   Take 70 mg by mouth once a week.         Marland Kitchen atorvastatin (LIPITOR) 20 MG tablet   Oral   Take 20 mg by mouth daily.         . benazepril-hydrochlorthiazide (LOTENSIN HCT) 20-12.5 MG per tablet   Oral   Take 1 tablet by mouth daily.         . benzonatate (TESSALON PERLES) 100 MG capsule   Oral   Take 1 capsule (100 mg total) by mouth 3 (three) times daily as needed for cough.   15 capsule   0   . budesonide-formoterol (SYMBICORT) 160-4.5 MCG/ACT inhaler   Inhalation   Inhale 2 puffs into the lungs 2 (two) times daily.          Marland Kitchen buPROPion (WELLBUTRIN SR) 150 MG 12 hr tablet   Oral   Take 150 mg by mouth 2 (two) times daily.         . Cholecalciferol (VITAMIN D3) 2000 UNITS TABS   Oral   Take 1 capsule by mouth daily.         . clindamycin (CLEOCIN) 300 MG capsule   Oral   Take 1 capsule (300 mg total) by mouth 4 (four) times daily.   30 capsule   0   . cloNIDine (CATAPRES) 0.1 MG tablet   Oral   Take 0.1 mg by mouth daily.          . DULoxetine (CYMBALTA) 30 MG capsule   Oral   Take 30 mg by mouth 2 (two) times daily.         Marland Kitchen gabapentin (NEURONTIN) 600 MG tablet   Oral   Take 600 mg by mouth at bedtime.          . metoprolol succinate (TOPROL-XL) 50 MG 24 hr tablet   Oral   Take 50 mg by mouth daily. Take with  or immediately following a meal.         . montelukast (SINGULAIR) 10 MG tablet   Oral   Take 10 mg by mouth at bedtime.          Marland Kitchen omeprazole (PRILOSEC) 20 MG capsule   Oral   Take 20 mg by mouth daily.         Marland Kitchen oxyCODONE-acetaminophen (ROXICET) 5-325 MG tablet   Oral   Take 1-2 tablets by mouth every 4 (four) hours as needed for severe pain.   15 tablet   0   . potassium chloride (K-DUR) 10 MEQ tablet   Oral   Take 10 mEq by mouth daily.         . predniSONE (DELTASONE) 20 MG tablet   Oral   Take 2 tablets (40 mg total) by mouth daily. Patient not taking: Reported on 05/09/2015   10 tablet   0   . tiotropium (SPIRIVA) 18 MCG inhalation capsule   Inhalation   Place 18 mcg into inhaler and inhale daily.         . Vitamin D, Ergocalciferol, (DRISDOL) 50000 UNITS CAPS capsule   Oral   Take 50,000 Units by mouth every 7 (seven) days.           Allergies Penicillins; Sulfa drugs cross reactors; Tetracyclines & related; and Tramadol  Family History  Problem Relation Age of Onset  . Emphysema Mother   . Emphysema Maternal Uncle   . Asthma      daughter  . Heart failure Father   . Heart failure Maternal Grandmother   . Cancer Mother      Social History Social History  Substance Use Topics  . Smoking status: Former Smoker -- 1.00 packs/day for 40 years    Quit date: 12/24/2009  . Smokeless tobacco: Never Used  . Alcohol Use: Yes     Comment: rare    Review of Systems Constitutional: No fever/chills Eyes: No visual changes. ENT: No sore throat. Positive for right jaw swollen and edematous with warmth to palpation. Abscess palpable approximately 2 x 2 centimeters Cardiovascular: Denies chest pain. Respiratory: Denies shortness of breath. Gastrointestinal: No abdominal pain.  No nausea, no vomiting.  No diarrhea.  No constipation. Genitourinary: Negative for dysuria. Musculoskeletal: Negative for back pain. Skin: Negative for rash. Neurological: Negative for headaches, focal weakness or numbness.  10-point ROS otherwise negative.  ____________________________________________   PHYSICAL EXAM:  VITAL SIGNS: ED Triage Vitals  Enc Vitals Group     BP --      Pulse Rate 12/05/15 1337 88     Resp 12/05/15 1337 16     Temp 12/05/15 1337 99.3 F (37.4 C)     Temp Source 12/05/15 1337 Oral     SpO2 12/05/15 1337 98 %     Weight 12/05/15 1337 130 lb (58.968 kg)     Height 12/05/15 1337 5\' 2"  (1.575 m)     Head Cir --      Peak Flow --      Pain Score --      Pain Loc --      Pain Edu? --      Excl. in [place]? --     Constitutional: Alert and oriented. Well appearing and in no acute distress. Eyes: Conjunctivae are normal. PERRL. EOMI. Head: Atraumatic. Nose: No congestion/rhinnorhea. Mouth/Throat: Mucous membranes are moist.  Oropharynx non-erythematous. Neck: No stridor.   Cardiovascular: Normal rate, regular rhythm. Grossly normal heart sounds.  Good  peripheral circulation. Respiratory: Normal respiratory effort.  No retractions. Lungs CTAB. Musculoskeletal: No lower extremity tenderness nor edema.  No joint effusions. Neurologic:  Normal speech and language. No gross focal neurologic deficits are  appreciated. No gait instability. Skin:  Skin is warm, dry and intact. No rash noted. Psychiatric: Mood and affect are normal. Speech and behavior are normal.  ____________________________________________   LABS (all labs ordered are listed, but only abnormal results are displayed)  Labs Reviewed  CBC WITH DIFFERENTIAL/PLATELET - Abnormal; Notable for the following:    WBC 19.0 (*)    Platelets 476 (*)    Neutro Abs 14.4 (*)    Monocytes Absolute 1.8 (*)    All other components within normal limits  COMPREHENSIVE METABOLIC PANEL - Abnormal; Notable for the following:    Sodium 130 (*)    Chloride 92 (*)    Glucose, Bld 123 (*)    All other components within normal limits   ____________________________________________  RADIOLOGY   1. Prominent dental caries and periapical abscess involving the right first mandibular premolar (tooth 28) 2. Associated subperiosteal abscess measures 10 x 9 x 3 mm. 3. Extensive secondary inflammation over the right side of the mandible with thickening of the platysma end reactive sized right level 1B lymph nodes. ____________________________________________   PROCEDURES  Procedure(s) performed: None  Critical Care performed: No  ____________________________________________   INITIAL IMPRESSION / ASSESSMENT AND PLAN / ED COURSE  Pertinent labs & imaging results that were available during my care of the patient were reviewed by me and considered in my medical decision making (see chart for details).  Dental caries with periapical and subperiosteal abscess. ENT on call notified. Patient refuses to be admitted and desires to see a provider as an outpatient will follow-up with the ENT, Dr. Kathyrn Sheriff tomorrow. A list of dental providers were provided to the patient and she was encouraged to follow up with dentistry as soon as possible for tooth removal. ____________________________________________   FINAL CLINICAL IMPRESSION(S) / ED  DIAGNOSES  Final diagnoses:  Periapical abscess with facial involvement  Subperiosteal abscess of mastoid, right      Arlyss Repress, PA-C 12/05/15 1812  Nena Polio, MD 12/07/15 1515

## 2015-12-05 NOTE — Discharge Instructions (Signed)
Need to call ENT doctor tomorrow for an appointment for evaluation and possible drainage.   Dental Abscess A dental abscess is a collection of pus in or around a tooth. CAUSES This condition is caused by a bacterial infection around the root of the tooth that involves the inner part of the tooth (pulp). It may result from:  Severe tooth decay.  Trauma to the tooth that allows bacteria to enter into the pulp, such as a broken or chipped tooth.  Severe gum disease around a tooth. SYMPTOMS Symptoms of this condition include:  Severe pain in and around the infected tooth.  Swelling and redness around the infected tooth, in the mouth, or in the face.  Tenderness.  Pus drainage.  Bad breath.  Bitter taste in the mouth.  Difficulty swallowing.  Difficulty opening the mouth.  Nausea.  Vomiting.  Chills.  Swollen neck glands.  Fever. DIAGNOSIS This condition is diagnosed with examination of the infected tooth. During the exam, your dentist may tap on the infected tooth. Your dentist will also ask about your medical and dental history and may order X-rays. TREATMENT This condition is treated by eliminating the infection. This may be done with:  Antibiotic medicine.  A root canal. This may be performed to save the tooth.  Pulling (extracting) the tooth. This may also involve draining the abscess. This is done if the tooth cannot be saved. HOME CARE INSTRUCTIONS  Take medicines only as directed by your dentist.  If you were prescribed antibiotic medicine, finish all of it even if you start to feel better.  Rinse your mouth (gargle) often with salt water to relieve pain or swelling.  Do not drive or operate heavy machinery while taking pain medicine.  Do not apply heat to the outside of your mouth.  Keep all follow-up visits as directed by your dentist. This is important. SEEK MEDICAL CARE IF:  Your pain is worse and is not helped by medicine. SEEK IMMEDIATE  MEDICAL CARE IF:  You have a fever or chills.  Your symptoms suddenly get worse.  You have a very bad headache.  You have problems breathing or swallowing.  You have trouble opening your mouth.  You have swelling in your neck or around your eye.   This information is not intended to replace advice given to you by your health care provider. Make sure you discuss any questions you have with your health care provider.   Document Released: 12/10/2005 Document Revised: 04/26/2015 Document Reviewed: 12/07/2014 Elsevier Interactive Patient Education 2016 [place] [employer]  [place] [facility] and [place] [email or web address] [phone])  [facility] [phone])  [place] [phone])  [place] [phone])  [facility] [phone]) This clinic caters to the indigent population and is on a lottery system. Location: [employer] [facility], [employer], [place], [place] Clinic Hours: Wednesdays from 6pm - 9pm, patients seen by a lottery system. For dates, call or go to [email or web address] Services: Cleanings, fillings and simple extractions. Payment Options: DENTAL WORK IS FREE OF CHARGE. Bring proof of income or support. Best way to get seen: Arrive at 5:15 pm - this is a lottery, NOT first come/first serve, so arriving earlier will not increase your chances of being seen.     [facility] [phone] Select option 1 for emergencies   Location: [facility]  [facility], [employer], [address], [place] Clinic Hours: No walk-ins accepted - call the day before to schedule an appointment. Check in times are 9:30 am and 1:30 pm. Services: Simple extractions, temporary fillings,  pulpectomy/pulp debridement, uncomplicated abscess drainage. Payment Options: PAYMENT IS DUE AT THE TIME OF SERVICE.  Fee is usually $100-200, additional surgical procedures (e.g. abscess drainage) may be extra. Cash, checks, Visa/MasterCard accepted.  Can file Medicaid if patient is covered for dental - patient should call case worker to check. No discount for [facility] patients. Best way to get seen: MUST call the day before and get onto the schedule. Can usually be seen the next 1-2 days. No walk-ins accepted.     [place] [phone]   Location: [place], [place] Clinic Hours: M, W, Th, F 8am or 1:30pm, Tues 9a or 1:30 - first come/first served. Services: Simple extractions, temporary fillings, uncomplicated abscess drainage.  You do not need to be an [facility] resident. Payment Options: PAYMENT IS DUE AT THE TIME OF SERVICE. Dental insurance, otherwise sliding scale - bring proof of income or support. Depending on income and treatment needed, cost is usually $50-200. Best way to get seen: Arrive early as it is first come/first served.     [facility] [phone]   Location: [place] [facility] Hours: Mon-Thu 8a-5p Services: Most basic dental services including extractions and fillings. Payment Options: PAYMENT IS DUE AT THE TIME OF SERVICE. Sliding scale, up to 50% off - bring proof if income or support. Medicaid with dental option accepted. Best way to get seen: Call to schedule an appointment, can usually be seen within 2 weeks OR they will try to see walk-ins - show up at [place] or 2p (you may have to wait).     [facility] [place] RESIDENTS ONLY   Location: [facility], [place] [address] [place], [place] [address] Clinic Hours: By appointment only. Monday - Thursday 8am-5pm, Friday  8am-12pm Services: Cleanings, fillings, extractions. Payment Options: PAYMENT IS DUE AT THE TIME OF SERVICE. Cash, Visa or MasterCard. Sliding scale - $30 minimum per service. Best way to get seen: Come in to office, complete packet and make an appointment - need proof of income or support monies for each household member and proof of [facility] residence. Usually takes about a month to get in.     [facility] [phone]   Location: [address] [place] Clinic Hours: Walk-in Urgent Care Dental Services are offered Monday-Friday mornings only. The numbers of emergencies accepted daily is limited to the number of providers available. Maximum 15 - Mondays, Wednesdays & Thursdays Maximum 10 - Tuesdays & Fridays Services: You do not need to be a [facility] resident to be seen for a dental emergency. Emergencies are defined as pain, swelling, abnormal bleeding, or dental trauma. Walkins will receive x-rays if needed. NOTE: Dental cleaning is not an emergency. Payment Options: PAYMENT IS DUE AT THE TIME OF SERVICE. Minimum co-pay is $40.00 for uninsured patients. Minimum co-pay is $3.00 for Medicaid with dental coverage. Dental Insurance is accepted and must be presented at time of visit. Medicare does not cover dental. Forms of payment: Cash, credit card, checks. Best way to get seen: If not previously registered with the clinic, walk-in dental registration begins at 7:15 am and is on a first come/first serve basis. If previously registered with  the clinic, call to make an appointment.     [facility] [place] ONLY   Location: [address], [place], [place] Clinic Hours: Mon-Thu 10a-2p Services: Extractions only! Payment Options: FREE (donations accepted) - bring proof of income or support Best way to get seen: Call and schedule an appointment OR come at 8am on the 1st Monday of every month  (except for holidays) when it is first come/first served.     [facility] [phone]   Location: [place], [place] Clinic Hours: Friday mornings Services, Payment Options, Best way to get seen: Call for info

## 2015-12-28 ENCOUNTER — Emergency Department
Admission: EM | Admit: 2015-12-28 | Discharge: 2015-12-28 | Disposition: A | Discharge status: Home / Self Care | Payer: Medicare Other | Attending: Emergency Medicine | Admitting: Emergency Medicine

## 2015-12-28 DIAGNOSIS — Y9389 Activity, other specified: Secondary | ICD-10-CM | POA: Insufficient documentation

## 2015-12-28 DIAGNOSIS — X58XXXA Exposure to other specified factors, initial encounter: Secondary | ICD-10-CM | POA: Insufficient documentation

## 2015-12-28 DIAGNOSIS — Z7951 Long term (current) use of inhaled steroids: Secondary | ICD-10-CM | POA: Insufficient documentation

## 2015-12-28 DIAGNOSIS — Y998 Other external cause status: Secondary | ICD-10-CM | POA: Insufficient documentation

## 2015-12-28 DIAGNOSIS — E860 Dehydration: Secondary | ICD-10-CM

## 2015-12-28 DIAGNOSIS — Z792 Long term (current) use of antibiotics: Secondary | ICD-10-CM | POA: Insufficient documentation

## 2015-12-28 DIAGNOSIS — Z87891 Personal history of nicotine dependence: Secondary | ICD-10-CM | POA: Diagnosis not present

## 2015-12-28 DIAGNOSIS — Z79899 Other long term (current) drug therapy: Secondary | ICD-10-CM | POA: Insufficient documentation

## 2015-12-28 DIAGNOSIS — Z880 Allergy status to penicillin: Secondary | ICD-10-CM | POA: Insufficient documentation

## 2015-12-28 DIAGNOSIS — I10 Essential (primary) hypertension: Secondary | ICD-10-CM | POA: Insufficient documentation

## 2015-12-28 DIAGNOSIS — T7840XA Allergy, unspecified, initial encounter: Secondary | ICD-10-CM

## 2015-12-28 DIAGNOSIS — Y9289 Other specified places as the place of occurrence of the external cause: Secondary | ICD-10-CM | POA: Diagnosis not present

## 2015-12-28 DIAGNOSIS — L500 Allergic urticaria: Secondary | ICD-10-CM | POA: Insufficient documentation

## 2015-12-28 LAB — COMPREHENSIVE METABOLIC PANEL
ALBUMIN: 3.6 g/dL (ref 3.5–5.0)
ALT: 11 U/L — ABNORMAL LOW (ref 14–54)
ANION GAP: 7 (ref 5–15)
AST: 21 U/L (ref 15–41)
Alkaline Phosphatase: 64 U/L (ref 38–126)
BUN: 23 mg/dL — ABNORMAL HIGH (ref 6–20)
CHLORIDE: 98 mmol/L — AB (ref 101–111)
CO2: 28 mmol/L (ref 22–32)
Calcium: 8.9 mg/dL (ref 8.9–10.3)
Creatinine, Ser: 1.19 mg/dL — ABNORMAL HIGH (ref 0.44–1.00)
GFR calc non Af Amer: 46 mL/min — ABNORMAL LOW (ref >60)
GFR, EST AFRICAN AMERICAN: 54 mL/min — AB (ref >60)
Glucose, Bld: 96 mg/dL (ref 65–99)
Potassium: 3.2 mmol/L — ABNORMAL LOW (ref 3.5–5.1)
SODIUM: 133 mmol/L — AB (ref 135–145)
Total Bilirubin: 0.9 mg/dL (ref 0.3–1.2)
Total Protein: 6.5 g/dL (ref 6.5–8.1)

## 2015-12-28 LAB — CBC WITH DIFFERENTIAL/PLATELET
BASOS PCT: 0 %
Basophils Absolute: 0 10*3/uL (ref 0–0.1)
EOS ABS: 0.1 10*3/uL (ref 0–0.7)
EOS PCT: 1 %
HCT: 35.5 % (ref 35.0–47.0)
Hemoglobin: 11.6 g/dL — ABNORMAL LOW (ref 12.0–16.0)
LYMPHS ABS: 3.4 10*3/uL (ref 1.0–3.6)
Lymphocytes Relative: 31 %
MCH: 27.9 pg (ref 26.0–34.0)
MCHC: 32.8 g/dL (ref 32.0–36.0)
MCV: 84.9 fL (ref 80.0–100.0)
MONOS PCT: 12 %
Monocytes Absolute: 1.3 10*3/uL — ABNORMAL HIGH (ref 0.2–0.9)
Neutro Abs: 6.1 10*3/uL (ref 1.4–6.5)
Neutrophils Relative %: 56 %
PLATELETS: 447 10*3/uL — AB (ref 150–440)
RBC: 4.18 MIL/uL (ref 3.80–5.20)
RDW: 14.9 % — ABNORMAL HIGH (ref 11.5–14.5)
WBC: 11.1 10*3/uL — AB (ref 3.6–11.0)

## 2015-12-28 MED ORDER — FAMOTIDINE 20 MG PO TABS
20.0000 mg | ORAL_TABLET | Freq: Once | ORAL | Status: AC
  Administered 2015-12-28: 20 mg via ORAL
  Filled 2015-12-28: qty 1

## 2015-12-28 MED ORDER — DIPHENHYDRAMINE HCL 50 MG/ML IJ SOLN
12.5000 mg | Freq: Once | INTRAMUSCULAR | Status: AC
  Administered 2015-12-28: 12.5 mg via INTRAVENOUS
  Filled 2015-12-28: qty 1

## 2015-12-28 MED ORDER — HYDROXYZINE HCL 25 MG PO TABS
25.0000 mg | ORAL_TABLET | Freq: Once | ORAL | Status: AC
  Administered 2015-12-28: 25 mg via ORAL
  Filled 2015-12-28: qty 1

## 2015-12-28 MED ORDER — SODIUM CHLORIDE 0.9 % IV BOLUS (SEPSIS)
1000.0000 mL | Freq: Once | INTRAVENOUS | Status: AC
  Administered 2015-12-28: 1000 mL via INTRAVENOUS

## 2015-12-28 MED ORDER — PREDNISONE 10 MG (21) PO TBPK: ORAL_TABLET | ORAL | Status: DC

## 2015-12-28 MED ORDER — HYDROXYZINE HCL 25 MG PO TABS: 25.0000 mg | ORAL_TABLET | Freq: Four times a day (QID) | ORAL | Status: DC | PRN

## 2015-12-28 MED ORDER — FAMOTIDINE 20 MG PO TABS: 20.0000 mg | ORAL_TABLET | Freq: Every day | ORAL | Status: DC

## 2015-12-28 MED ORDER — METHYLPREDNISOLONE SODIUM SUCC 125 MG IJ SOLR
125.0000 mg | Freq: Once | INTRAMUSCULAR | Status: AC
  Administered 2015-12-28: 125 mg via INTRAVENOUS
  Filled 2015-12-28: qty 2

## 2015-12-28 NOTE — ED Notes (Signed)
Pt c/o itchy rash all over for the past 3 days, denies any new exposures to cause allergic reaction.. Pt is in NAD, respirations WNL.Marland Kitchen

## 2015-12-28 NOTE — ED Notes (Signed)
Blood sent to lab at this time that was collected at 15:47

## 2015-12-28 NOTE — Discharge Instructions (Signed)

## 2015-12-28 NOTE — ED Provider Notes (Signed)
[facility] Emergency Department Provider Note ____________________________________________  Time seen: Approximately 4:29 PM  I have reviewed the triage vital signs and the nursing notes.   HISTORY  Chief Complaint Allergic Reaction   HPI Kelsey Long is a 70 y.o. female presents to the emergency department for evaluation of rash, hives, and intense itching. She reports that the only new medication or medication change is that she finished clindamycin approximately 7 days prior to the onset of hives. She denies any shortness of breath or feeling of throat discomfort or swelling. She has been using Benadryl at home and hydrocortisone cream without any relief of the symptoms.   Past Medical History  Diagnosis Date  . COPD (chronic obstructive pulmonary disease) ([place])   . Hypertension   . Chronic pain   . Anxiety   . Uterine cancer [facility])     Patient Active Problem List   Diagnosis Date Noted  . DOE (dyspnea on exertion) 09/24/2012    Past Surgical History  Procedure Laterality Date  . Gallbladder surgery    . Neck surgery    . Total abdominal hysterectomy      Current Outpatient Rx  Name  Route  Sig  Dispense  Refill  . albuterol (PROVENTIL HFA;VENTOLIN HFA) 108 (90 BASE) MCG/ACT inhaler   Inhalation   Inhale 2 puffs into the lungs every 6 (six) hours as needed for wheezing or shortness of breath.          Marland Kitchen albuterol (PROVENTIL) (2.5 MG/3ML) 0.083% nebulizer solution   Nebulization   Take 3 mLs (2.5 mg total) by nebulization every 6 (six) hours as needed for wheezing or shortness of breath.   75 mL   2   . alendronate (FOSAMAX) 70 MG tablet   Oral   Take 70 mg by mouth once a week.         Marland Kitchen atorvastatin (LIPITOR) 20 MG tablet   Oral   Take 20 mg by mouth daily.         . benazepril-hydrochlorthiazide (LOTENSIN HCT) 20-12.5 MG per tablet   Oral   Take 1 tablet by mouth daily.         . benzonatate (TESSALON PERLES) 100  MG capsule   Oral   Take 1 capsule (100 mg total) by mouth 3 (three) times daily as needed for cough.   15 capsule   0   . budesonide-formoterol (SYMBICORT) 160-4.5 MCG/ACT inhaler   Inhalation   Inhale 2 puffs into the lungs 2 (two) times daily.         Marland Kitchen buPROPion (WELLBUTRIN SR) 150 MG 12 hr tablet   Oral   Take 150 mg by mouth 2 (two) times daily.         . Cholecalciferol (VITAMIN D3) 2000 UNITS TABS   Oral   Take 1 capsule by mouth daily.         . clindamycin (CLEOCIN) 300 MG capsule   Oral   Take 1 capsule (300 mg total) by mouth 4 (four) times daily.   30 capsule   0   . cloNIDine (CATAPRES) 0.1 MG tablet   Oral   Take 0.1 mg by mouth daily.          . DULoxetine (CYMBALTA) 30 MG capsule   Oral   Take 30 mg by mouth 2 (two) times daily.         . famotidine (PEPCID) 20 MG tablet   Oral   Take 1 tablet (  20 mg total) by mouth daily.   7 tablet   0   . gabapentin (NEURONTIN) 600 MG tablet   Oral   Take 600 mg by mouth at bedtime.          . hydrOXYzine (ATARAX/VISTARIL) 25 MG tablet   Oral   Take 1 tablet (25 mg total) by mouth every 6 (six) hours as needed.   30 tablet   0   . metoprolol succinate (TOPROL-XL) 50 MG 24 hr tablet   Oral   Take 50 mg by mouth daily. Take with or immediately following a meal.         . montelukast (SINGULAIR) 10 MG tablet   Oral   Take 10 mg by mouth at bedtime.          Marland Kitchen omeprazole (PRILOSEC) 20 MG capsule   Oral   Take 20 mg by mouth daily.         Marland Kitchen oxyCODONE-acetaminophen (ROXICET) 5-325 MG tablet   Oral   Take 1-2 tablets by mouth every 4 (four) hours as needed for severe pain.   15 tablet   0   . potassium chloride (K-DUR) 10 MEQ tablet   Oral   Take 10 mEq by mouth daily.         . predniSONE (STERAPRED UNI-PAK 21 TAB) 10 MG (21) TBPK tablet      Take 6 tablets on day 1 Take 5 tablets on day 2 Take 4 tablets on day 3 Take 3 tablets on day 4 Take 2 tablets on day 5 Take 1  tablet on day 6   21 tablet   0   . tiotropium (SPIRIVA) 18 MCG inhalation capsule   Inhalation   Place 18 mcg into inhaler and inhale daily.         . Vitamin D, Ergocalciferol, (DRISDOL) 50000 UNITS CAPS capsule   Oral   Take 50,000 Units by mouth every 7 (seven) days.           Allergies Penicillins; Sulfa drugs cross reactors; Tetracyclines & related; and Tramadol  Family History  Problem Relation Age of Onset  . Emphysema Mother   . Emphysema Maternal Uncle   . Asthma      daughter  . Heart failure Father   . Heart failure Maternal Grandmother   . Cancer Mother     Social History Social History  Substance Use Topics  . Smoking status: Former Smoker -- 1.00 packs/day for 40 years    Quit date: 12/24/2009  . Smokeless tobacco: Never Used  . Alcohol Use: Yes     Comment: rare    Review of Systems   Constitutional: No fever/chills Eyes: No visual changes. ENT: No congestion or rhinorrhea Cardiovascular: Denies chest pain. Respiratory: Denies shortness of breath. Gastrointestinal: No abdominal pain.  No nausea, no vomiting.  No diarrhea.  No constipation. Genitourinary: Negative for dysuria. Musculoskeletal: Negative for back pain. Skin: Hives generalized from head to toe Neurological: Negative for headaches, focal weakness or numbness.  10-point ROS otherwise negative.  ____________________________________________   PHYSICAL EXAM:  VITAL SIGNS: ED Triage Vitals  Enc Vitals Group     BP 12/28/15 1436 99/45 mmHg     Pulse Rate 12/28/15 1436 87     Resp 12/28/15 1436 18     Temp 12/28/15 1436 97.6 F (36.4 C)     Temp Source 12/28/15 1436 Oral     SpO2 12/28/15 1436 94 %     Weight 12/28/15  1436 130 lb (58.968 kg)     Height 12/28/15 1436 5\' 2"  (1.575 m)     Head Cir --      Peak Flow --      Pain Score --      Pain Loc --      Pain Edu? --      Excl. in [place]? --    Constitutional: Alert and oriented. Well appearing and in no acute  distress. Eyes: Conjunctivae are normal. PERRL. EOMI. Head: Atraumatic. Nose: No congestion/rhinnorhea. Mouth/Throat: Mucous membranes are moist.  Oropharynx non-erythematous. No oral lesions. Airway patent without obvious edema Neck: No stridor. Cardiovascular: Normal rate, regular rhythm.  Good peripheral circulation. Respiratory: Normal respiratory effort.  No retractions. Lungs CTAB. Gastrointestinal: Soft and nontender. No distention. No abdominal bruits.  Musculoskeletal: No lower extremity tenderness nor edema.  No joint effusions. Neurologic:  Normal speech and language. No gross focal neurologic deficits are appreciated. Speech is normal. No gait instability. Skin:  Hives noted from scalp to feet; Negative for petechiae.  Psychiatric: Mood and affect are normal. Speech and behavior are normal.  ____________________________________________   LABS (all labs ordered are listed, but only abnormal results are displayed)  Labs Reviewed  CBC WITH DIFFERENTIAL/PLATELET - Abnormal; Notable for the following:    WBC 11.1 (*)    Hemoglobin 11.6 (*)    RDW 14.9 (*)    Platelets 447 (*)    Monocytes Absolute 1.3 (*)    All other components within normal limits  COMPREHENSIVE METABOLIC PANEL - Abnormal; Notable for the following:    Sodium 133 (*)    Potassium 3.2 (*)    Chloride 98 (*)    BUN 23 (*)    Creatinine, Ser 1.19 (*)    ALT 11 (*)    GFR calc non Af Amer 46 (*)    GFR calc Af Amer 54 (*)    All other components within normal limits   ____________________________________________  EKG   ____________________________________________  RADIOLOGY   ____________________________________________   PROCEDURES  Procedure(s) performed:  ____________________________________________   INITIAL IMPRESSION / ASSESSMENT AND PLAN / ED COURSE  Pertinent labs & imaging results that were available during my care of the patient were reviewed by me and considered in my medical  decision making (see chart for details).  Patient was given 1 L of normal saline, IV Benadryl, IV Solu-Medrol, and by mouth Pepcid and hydroxyzine while in the emergency department with near relief of symptoms. She will be prescribed prednisone, Pepcid, and hydroxyzine to take at home. Patient was advised to increase her intake of water. She was advised to follow-up with her primary care provider within the next 7-10 days for repeat labs. Patient was advised to return to the emergency department for symptoms that change or worsen if she is unable to schedule an appointment with her primary care provider. ____________________________________________   FINAL CLINICAL IMPRESSION(S) / ED DIAGNOSES  Final diagnoses:  Allergic reaction, initial encounter  Dehydration       Victorino Dike, FNP 12/28/15 1912  Eula Listen, MD 12/28/15 2311

## 2015-12-28 NOTE — ED Notes (Signed)
Lab states CMP is still on the instrument at this time and has not hemolyzed.

## 2015-12-28 NOTE — ED Notes (Addendum)
Pt noticed itching and hives starting on Sunday night. Pt reports prior to Christmas Eve patient was in hospital for abscess and was given Clindamycin. Pt finished 7 day course of atbx. Pt has been using Benadryl OTC and using cortisone cream. Hives throughout body. Pt sliced finger on food processor blade yesterday as well on right thumb.

## 2016-02-13 ENCOUNTER — Encounter: Payer: Self-pay | Admitting: Podiatry

## 2016-02-13 ENCOUNTER — Ambulatory Visit (INDEPENDENT_AMBULATORY_CARE_PROVIDER_SITE_OTHER): Payer: Medicare Other | Admitting: Podiatry

## 2016-02-13 VITALS — BP 158/90 | HR 83 | Resp 16

## 2016-02-13 DIAGNOSIS — Q828 Other specified congenital malformations of skin: Secondary | ICD-10-CM

## 2016-02-13 NOTE — Progress Notes (Signed)
   Subjective:    Patient ID: Kelsey Long, female    DOB: [birth year], 70 y.o.   MRN: [id number]  HPI: She presents today with a several month duration of a painful lesion plantar aspect fourth metatarsal head right foot. She states this been there for quite some time seems to be getting bigger and causing pain in a wider distribution. She denies any trauma any foreign object and thinks that must be a wart since she had one in the past. She does go on to say that it does not feel like a wart.    Review of Systems  Constitutional: Positive for activity change and fatigue.  HENT: Positive for hearing loss and sinus pressure.   Eyes: Positive for visual disturbance.  Respiratory: Positive for apnea, cough, chest tightness, shortness of breath and wheezing.   Gastrointestinal: Positive for constipation and abdominal distention.  Musculoskeletal: Positive for back pain, arthralgias and gait problem.  Allergic/Immunologic: Positive for food allergies.  Neurological: Positive for tremors.  Hematological: Bruises/bleeds easily.  All other systems reviewed and are negative.      Objective:   Physical Exam:vital signs are stable alert and oriented 3. Pulses are palpable. Neurologic sensorium is intact. Deep tendon reflexes are intact bilateral muscle strength +5 over 5 dorsiflexion plantar flexors and inverters everters all intrinsic musculature is intact. Orthopedic evaluation shows all joints distal to the ankle range of motion without crepitation. Cutaneous evaluationdemonstrates supple well-hydrated cutis with a solitary reactive porokeratotic lesion sub-or metatarsal head of the right foot. No open lesions or wounds no signs of infection.        Assessment & Plan:  Porokeratosis right foot.  Plan: Mechanical and chemical debridement under occlusion for 3 days prior to getting it wet and then will notify us or follow up with [place] in 6 weeks.

## 2016-03-26 ENCOUNTER — Encounter (INDEPENDENT_AMBULATORY_CARE_PROVIDER_SITE_OTHER): Payer: Medicare Other | Admitting: Podiatry

## 2016-03-26 NOTE — Progress Notes (Signed)
This encounter was created in error - please disregard.

## 2016-05-01 ENCOUNTER — Other Ambulatory Visit: Payer: Self-pay | Admitting: Adult Health

## 2016-05-01 DIAGNOSIS — M79604 Pain in right leg: Secondary | ICD-10-CM

## 2016-05-08 ENCOUNTER — Ambulatory Visit
Admission: RE | Admit: 2016-05-08 | Discharge: 2016-05-08 | Disposition: A | Discharge status: Home / Self Care | Payer: Medicare Other | Source: Ambulatory Visit | Attending: Adult Health | Admitting: Adult Health

## 2016-05-08 DIAGNOSIS — M79604 Pain in right leg: Secondary | ICD-10-CM

## 2016-07-09 ENCOUNTER — Ambulatory Visit: Payer: Medicare Other | Admitting: Podiatry

## 2016-08-09 DIAGNOSIS — J449 Chronic obstructive pulmonary disease, unspecified: Secondary | ICD-10-CM | POA: Diagnosis not present

## 2016-08-09 DIAGNOSIS — E119 Type 2 diabetes mellitus without complications: Secondary | ICD-10-CM | POA: Diagnosis not present

## 2016-08-09 DIAGNOSIS — J45909 Unspecified asthma, uncomplicated: Secondary | ICD-10-CM | POA: Diagnosis not present

## 2016-08-09 DIAGNOSIS — M545 Low back pain: Secondary | ICD-10-CM | POA: Diagnosis not present

## 2016-08-09 DIAGNOSIS — I10 Essential (primary) hypertension: Secondary | ICD-10-CM | POA: Diagnosis not present

## 2016-09-03 DIAGNOSIS — R7301 Impaired fasting glucose: Secondary | ICD-10-CM | POA: Diagnosis not present

## 2016-09-03 DIAGNOSIS — M150 Primary generalized (osteo)arthritis: Secondary | ICD-10-CM | POA: Diagnosis not present

## 2016-09-03 DIAGNOSIS — I10 Essential (primary) hypertension: Secondary | ICD-10-CM | POA: Diagnosis not present

## 2016-09-03 DIAGNOSIS — J449 Chronic obstructive pulmonary disease, unspecified: Secondary | ICD-10-CM | POA: Diagnosis not present

## 2016-09-03 DIAGNOSIS — J45909 Unspecified asthma, uncomplicated: Secondary | ICD-10-CM | POA: Diagnosis not present

## 2016-10-01 DIAGNOSIS — I10 Essential (primary) hypertension: Secondary | ICD-10-CM | POA: Diagnosis not present

## 2016-10-01 DIAGNOSIS — Z23 Encounter for immunization: Secondary | ICD-10-CM | POA: Diagnosis not present

## 2016-10-01 DIAGNOSIS — M545 Low back pain: Secondary | ICD-10-CM | POA: Diagnosis not present

## 2016-10-01 DIAGNOSIS — J449 Chronic obstructive pulmonary disease, unspecified: Secondary | ICD-10-CM | POA: Diagnosis not present

## 2016-10-29 DIAGNOSIS — I10 Essential (primary) hypertension: Secondary | ICD-10-CM | POA: Diagnosis not present

## 2016-10-29 DIAGNOSIS — J449 Chronic obstructive pulmonary disease, unspecified: Secondary | ICD-10-CM | POA: Diagnosis not present

## 2016-10-29 DIAGNOSIS — M545 Low back pain: Secondary | ICD-10-CM | POA: Diagnosis not present

## 2016-11-19 ENCOUNTER — Encounter: Payer: Self-pay | Admitting: Emergency Medicine

## 2016-11-19 ENCOUNTER — Emergency Department
Admission: EM | Admit: 2016-11-19 | Discharge: 2016-11-19 | Disposition: A | Discharge status: Home / Self Care | Payer: Commercial Managed Care - HMO | Attending: Emergency Medicine | Admitting: Emergency Medicine

## 2016-11-19 DIAGNOSIS — N3001 Acute cystitis with hematuria: Secondary | ICD-10-CM | POA: Diagnosis not present

## 2016-11-19 DIAGNOSIS — Z79899 Other long term (current) drug therapy: Secondary | ICD-10-CM | POA: Diagnosis not present

## 2016-11-19 DIAGNOSIS — J449 Chronic obstructive pulmonary disease, unspecified: Secondary | ICD-10-CM | POA: Diagnosis not present

## 2016-11-19 DIAGNOSIS — Z87891 Personal history of nicotine dependence: Secondary | ICD-10-CM | POA: Diagnosis not present

## 2016-11-19 DIAGNOSIS — I10 Essential (primary) hypertension: Secondary | ICD-10-CM | POA: Diagnosis not present

## 2016-11-19 DIAGNOSIS — R300 Dysuria: Secondary | ICD-10-CM | POA: Diagnosis present

## 2016-11-19 LAB — URINALYSIS COMPLETE WITH MICROSCOPIC (ARMC ONLY)
Bilirubin Urine: NEGATIVE
GLUCOSE, UA: NEGATIVE mg/dL
KETONES UR: NEGATIVE mg/dL
NITRITE: POSITIVE — AB
Protein, ur: NEGATIVE mg/dL
SPECIFIC GRAVITY, URINE: 1.008 (ref 1.005–1.030)
pH: 6 (ref 5.0–8.0)

## 2016-11-19 MED ORDER — NITROFURANTOIN MONOHYD MACRO 100 MG PO CAPS: 100.0000 mg | ORAL_CAPSULE | Freq: Two times a day (BID) | ORAL | 0 refills | Status: AC

## 2016-11-19 NOTE — ED Triage Notes (Signed)
Pt presents to ED with painful and frequent urination since yesterday morning. Pt states she tried to call her pcp but they were out of the office all day. Pt alert and calm with no distress noted.

## 2016-11-19 NOTE — ED Notes (Signed)
Discharge instructions reviewed with patient. Questions fielded by this RN. Patient verbalizes understanding of instructions. Patient discharged home in stable condition per Kinner MD . No acute distress noted at time of discharge.

## 2016-11-19 NOTE — ED Provider Notes (Signed)
[facility] Emergency Department Provider Note   ____________________________________________    I have reviewed the triage vital signs and the nursing notes.   HISTORY  Chief Complaint Urinary Frequency and Dysuria     HPI Kelsey Long is a 70 y.o. female who presents with urinary frequency and dysuria. She reports it burns at the very end of urination. She is also noticed a small amount of blood in her urine. She denies fevers or chills. She denies back pain. No nausea or vomiting.   Past Medical History:  Diagnosis Date  . Anxiety   . Chronic pain   . COPD (chronic obstructive pulmonary disease) ([place])   . Hypertension   . Uterine cancer [facility])     Patient Active Problem List   Diagnosis Date Noted  . DOE (dyspnea on exertion) 09/24/2012    Past Surgical History:  Procedure Laterality Date  . GALLBLADDER SURGERY    . NECK SURGERY    . TOTAL ABDOMINAL HYSTERECTOMY      Prior to Admission medications   Medication Sig Start Date End Date Taking? Authorizing Provider  albuterol (PROVENTIL HFA;VENTOLIN HFA) 108 (90 BASE) MCG/ACT inhaler Inhale 2 puffs into the lungs every 6 (six) hours as needed for wheezing or shortness of breath.     Historical Provider, MD  albuterol (PROVENTIL) (2.5 MG/3ML) 0.083% nebulizer solution Take 3 mLs (2.5 mg total) by nebulization every 6 (six) hours as needed for wheezing or shortness of breath. 05/02/15   III Luanna Cole Ruffian, PA-C  alendronate (FOSAMAX) 70 MG tablet Take 70 mg by mouth once a week. 04/30/15   Historical Provider, MD  atorvastatin (LIPITOR) 20 MG tablet Take 20 mg by mouth daily.    Historical Provider, MD  benazepril-hydrochlorthiazide (LOTENSIN HCT) 20-12.5 MG per tablet Take 1 tablet by mouth daily.    Historical Provider, MD  budesonide-formoterol (SYMBICORT) 160-4.5 MCG/ACT inhaler Inhale 2 puffs into the lungs 2 (two) times daily.    Historical Provider, MD  buPROPion (WELLBUTRIN SR)  150 MG 12 hr tablet Take 150 mg by mouth 2 (two) times daily.    Historical Provider, MD  Cholecalciferol (VITAMIN D3) 2000 UNITS TABS Take 1 capsule by mouth daily.    Historical Provider, MD  clindamycin (CLEOCIN) 300 MG capsule Take 1 capsule (300 mg total) by mouth 4 (four) times daily. 12/05/15   Pierce Crane Beers, PA-C  cloNIDine (CATAPRES) 0.1 MG tablet Take 0.1 mg by mouth daily.     Historical Provider, MD  doxycycline (VIBRAMYCIN) 100 MG capsule  02/03/16   Historical Provider, MD  DULoxetine (CYMBALTA) 30 MG capsule Take 30 mg by mouth 2 (two) times daily.    Historical Provider, MD  famotidine (PEPCID) 20 MG tablet Take 1 tablet (20 mg total) by mouth daily. 12/28/15 12/27/16  Victorino Dike, FNP  gabapentin (NEURONTIN) 600 MG tablet Take 600 mg by mouth at bedtime.     Historical Provider, MD  hydrOXYzine (ATARAX/VISTARIL) 25 MG tablet Take 1 tablet (25 mg total) by mouth every 6 (six) hours as needed. 12/28/15   Victorino Dike, FNP  metoprolol succinate (TOPROL-XL) 50 MG 24 hr tablet Take 50 mg by mouth daily. Take with or immediately following a meal.    Historical Provider, MD  montelukast (SINGULAIR) 10 MG tablet Take 10 mg by mouth at bedtime.  09/19/12   Historical Provider, MD  nitrofurantoin, macrocrystal-monohydrate, (MACROBID) 100 MG capsule Take 1 capsule (100 mg total) by mouth 2 (  two) times daily. 11/19/16 11/26/16  Lavonia Drafts, MD  omeprazole (PRILOSEC) 20 MG capsule Take 20 mg by mouth daily.    Historical Provider, MD  oxyCODONE-acetaminophen (PERCOCET) 10-325 MG tablet Take 1 tablet by mouth every 4 (four) hours as needed for pain.    Historical Provider, MD  potassium chloride (K-DUR) 10 MEQ tablet Take 10 mEq by mouth daily.    Historical Provider, MD  potassium chloride (K-DUR,KLOR-CON) 10 MEQ tablet  02/07/16   Historical Provider, MD  predniSONE (STERAPRED UNI-PAK 21 TAB) 10 MG (21) TBPK tablet Take 6 tablets on day 1 Take 5 tablets on day 2 Take 4 tablets on day 3 Take  3 tablets on day 4 Take 2 tablets on day 5 Take 1 tablet on day 6 12/28/15   Cari B Triplett, FNP  tiotropium (SPIRIVA) 18 MCG inhalation capsule Place 18 mcg into inhaler and inhale daily.    Historical Provider, MD  Vitamin D, Ergocalciferol, (DRISDOL) 50000 UNITS CAPS capsule Take 50,000 Units by mouth every 7 (seven) days.    Historical Provider, MD     Allergies Penicillins; Sulfa drugs cross reactors; Tetracyclines & related; and Tramadol  Family History  Problem Relation Age of Onset  . Emphysema Mother   . Cancer Mother   . Heart failure Father   . Emphysema Maternal Uncle   . Asthma      daughter  . Heart failure Maternal Grandmother     Social History Social History  Substance Use Topics  . Smoking status: Former Smoker    Packs/day: 1.00    Years: 40.00    Quit date: 12/24/2009  . Smokeless tobacco: Never Used  . Alcohol use Yes     Comment: rare    Review of Systems  Constitutional: No fever/chills  Cardiovascular: Denies chest pain. Respiratory: Denies shortness of breath. Gastrointestinal: No abdominal pain.  No nausea, no vomiting.   Genitourinary: As above. Musculoskeletal: Negative for back pain. Skin: Negative for rash. Neurological: Negative for headaches   10-point ROS otherwise negative.  ____________________________________________   PHYSICAL EXAM:  VITAL SIGNS: ED Triage Vitals  Enc Vitals Group     BP 11/19/16 1918 129/75     Pulse Rate 11/19/16 1918 86     Resp 11/19/16 1918 20     Temp 11/19/16 1918 97.5 F (36.4 C)     Temp Source 11/19/16 1918 Oral     SpO2 11/19/16 1918 97 %     Weight 11/19/16 1925 120 lb (54.4 kg)     Height 11/19/16 1925 5\' 2"  (1.575 m)     Head Circumference --      Peak Flow --      Pain Score 11/19/16 1918 4     Pain Loc --      Pain Edu? --      Excl. in [place]? --     Constitutional: Alert and oriented. No acute distress. Pleasant and interactive Eyes: Conjunctivae are normal.   Nose: No  congestion/rhinnorhea. Mouth/Throat: Mucous membranes are moist.    Cardiovascular: Normal rate, regular rhythm. Grossly normal heart sounds.  Good peripheral circulation. Respiratory: Normal respiratory effort.  No retractions. Lungs CTAB. Gastrointestinal: Soft and nontender. No distention.  No CVA tenderness. Genitourinary: deferred Musculoskeletal: No lower extremity tenderness nor edema.  Warm and well perfused Neurologic:  Normal speech and language. No gross focal neurologic deficits are appreciated.  Skin:  Skin is warm, dry and intact. No rash noted. Psychiatric: Mood and affect are normal.  Speech and behavior are normal.  ____________________________________________   LABS (all labs ordered are listed, but only abnormal results are displayed)  Labs Reviewed  URINALYSIS COMPLETEWITH MICROSCOPIC ([place]) - Abnormal; Notable for the following:       Result Value   Color, Urine AMBER (*)    APPearance CLEAR (*)    Hgb urine dipstick 3+ (*)    Nitrite POSITIVE (*)    Leukocytes, UA 3+ (*)    Bacteria, UA MANY (*)    Squamous Epithelial / LPF 0-5 (*)    All other components within normal limits   ____________________________________________  EKG  None ____________________________________________  RADIOLOGY  None ____________________________________________   PROCEDURES  Procedure(s) performed: No    Critical Care performed: No ____________________________________________   INITIAL IMPRESSION / ASSESSMENT AND PLAN / ED COURSE  Pertinent labs & imaging results that were available during my care of the patient were reviewed by me and considered in my medical decision making (see chart for details).  Patient well-appearing no acute distress. History of present illness this is consistent with UTI, urinalysis confirms this. We will treat with Macrobid given her penicillin allergy. Urine culture sent. No evidence of systemic infection. Return precautions  discussed.  Clinical Course    ____________________________________________   FINAL CLINICAL IMPRESSION(S) / ED DIAGNOSES  Final diagnoses:  Acute cystitis with hematuria      NEW MEDICATIONS STARTED DURING THIS VISIT:  New Prescriptions   NITROFURANTOIN, MACROCRYSTAL-MONOHYDRATE, (MACROBID) 100 MG CAPSULE    Take 1 capsule (100 mg total) by mouth 2 (two) times daily.     Note:  This document was prepared using Dragon voice recognition software and may include unintentional dictation errors.    Lavonia Drafts, MD 11/19/16 [phone]

## 2016-11-22 LAB — URINE CULTURE: Culture: >=100000 — AB

## 2016-11-29 DIAGNOSIS — I10 Essential (primary) hypertension: Secondary | ICD-10-CM | POA: Diagnosis not present

## 2016-11-29 DIAGNOSIS — Z1321 Encounter for screening for nutritional disorder: Secondary | ICD-10-CM | POA: Diagnosis not present

## 2016-11-29 DIAGNOSIS — D551 Anemia due to other disorders of glutathione metabolism: Secondary | ICD-10-CM | POA: Diagnosis not present

## 2016-11-29 DIAGNOSIS — E559 Vitamin D deficiency, unspecified: Secondary | ICD-10-CM | POA: Diagnosis not present

## 2016-11-29 DIAGNOSIS — R7301 Impaired fasting glucose: Secondary | ICD-10-CM | POA: Diagnosis not present

## 2016-11-29 DIAGNOSIS — J449 Chronic obstructive pulmonary disease, unspecified: Secondary | ICD-10-CM | POA: Diagnosis not present

## 2016-11-29 DIAGNOSIS — M545 Low back pain: Secondary | ICD-10-CM | POA: Diagnosis not present

## 2016-12-27 DIAGNOSIS — M150 Primary generalized (osteo)arthritis: Secondary | ICD-10-CM | POA: Diagnosis not present

## 2016-12-27 DIAGNOSIS — J45909 Unspecified asthma, uncomplicated: Secondary | ICD-10-CM | POA: Diagnosis not present

## 2016-12-27 DIAGNOSIS — M545 Low back pain: Secondary | ICD-10-CM | POA: Diagnosis not present

## 2016-12-27 DIAGNOSIS — J449 Chronic obstructive pulmonary disease, unspecified: Secondary | ICD-10-CM | POA: Diagnosis not present

## 2016-12-27 DIAGNOSIS — I10 Essential (primary) hypertension: Secondary | ICD-10-CM | POA: Diagnosis not present

## 2017-01-07 DIAGNOSIS — N399 Disorder of urinary system, unspecified: Secondary | ICD-10-CM | POA: Diagnosis not present

## 2017-01-07 DIAGNOSIS — R300 Dysuria: Secondary | ICD-10-CM | POA: Diagnosis not present

## 2017-01-07 DIAGNOSIS — R309 Painful micturition, unspecified: Secondary | ICD-10-CM | POA: Diagnosis not present

## 2017-01-22 DIAGNOSIS — M5136 Other intervertebral disc degeneration, lumbar region: Secondary | ICD-10-CM | POA: Diagnosis not present

## 2017-01-22 DIAGNOSIS — M545 Low back pain: Secondary | ICD-10-CM | POA: Diagnosis not present

## 2017-01-28 DIAGNOSIS — N3289 Other specified disorders of bladder: Secondary | ICD-10-CM | POA: Diagnosis not present

## 2017-01-28 DIAGNOSIS — N390 Urinary tract infection, site not specified: Secondary | ICD-10-CM | POA: Diagnosis not present

## 2017-01-28 DIAGNOSIS — I10 Essential (primary) hypertension: Secondary | ICD-10-CM | POA: Diagnosis not present

## 2017-01-28 DIAGNOSIS — J45909 Unspecified asthma, uncomplicated: Secondary | ICD-10-CM | POA: Diagnosis not present

## 2017-01-28 DIAGNOSIS — M545 Low back pain: Secondary | ICD-10-CM | POA: Diagnosis not present

## 2017-01-28 DIAGNOSIS — J449 Chronic obstructive pulmonary disease, unspecified: Secondary | ICD-10-CM | POA: Diagnosis not present

## 2017-02-25 DIAGNOSIS — I10 Essential (primary) hypertension: Secondary | ICD-10-CM | POA: Diagnosis not present

## 2017-02-25 DIAGNOSIS — M545 Low back pain: Secondary | ICD-10-CM | POA: Diagnosis not present

## 2017-02-25 DIAGNOSIS — J449 Chronic obstructive pulmonary disease, unspecified: Secondary | ICD-10-CM | POA: Diagnosis not present

## 2017-03-04 DIAGNOSIS — M545 Low back pain: Secondary | ICD-10-CM | POA: Diagnosis not present

## 2017-03-04 DIAGNOSIS — G894 Chronic pain syndrome: Secondary | ICD-10-CM | POA: Diagnosis not present

## 2017-03-04 DIAGNOSIS — M542 Cervicalgia: Secondary | ICD-10-CM | POA: Diagnosis not present

## 2017-03-04 DIAGNOSIS — M5416 Radiculopathy, lumbar region: Secondary | ICD-10-CM | POA: Diagnosis not present

## 2017-03-04 DIAGNOSIS — M79609 Pain in unspecified limb: Secondary | ICD-10-CM | POA: Diagnosis not present

## 2017-04-01 DIAGNOSIS — M545 Low back pain: Secondary | ICD-10-CM | POA: Diagnosis not present

## 2017-04-01 DIAGNOSIS — G894 Chronic pain syndrome: Secondary | ICD-10-CM | POA: Diagnosis not present

## 2017-04-01 DIAGNOSIS — M542 Cervicalgia: Secondary | ICD-10-CM | POA: Diagnosis not present

## 2017-05-07 DIAGNOSIS — G894 Chronic pain syndrome: Secondary | ICD-10-CM | POA: Diagnosis not present

## 2017-05-07 DIAGNOSIS — M545 Low back pain: Secondary | ICD-10-CM | POA: Diagnosis not present

## 2017-05-07 DIAGNOSIS — M542 Cervicalgia: Secondary | ICD-10-CM | POA: Diagnosis not present

## 2017-06-12 DIAGNOSIS — M542 Cervicalgia: Secondary | ICD-10-CM | POA: Diagnosis not present

## 2017-06-12 DIAGNOSIS — G894 Chronic pain syndrome: Secondary | ICD-10-CM | POA: Diagnosis not present

## 2017-06-12 DIAGNOSIS — M545 Low back pain: Secondary | ICD-10-CM | POA: Diagnosis not present

## 2017-07-10 DIAGNOSIS — M545 Low back pain: Secondary | ICD-10-CM | POA: Diagnosis not present

## 2017-07-10 DIAGNOSIS — M542 Cervicalgia: Secondary | ICD-10-CM | POA: Diagnosis not present

## 2017-07-10 DIAGNOSIS — Z79891 Long term (current) use of opiate analgesic: Secondary | ICD-10-CM | POA: Diagnosis not present

## 2017-07-10 DIAGNOSIS — G894 Chronic pain syndrome: Secondary | ICD-10-CM | POA: Diagnosis not present

## 2017-08-04 ENCOUNTER — Emergency Department: Payer: Medicare HMO

## 2017-08-04 ENCOUNTER — Encounter: Payer: Self-pay | Admitting: *Deleted

## 2017-08-04 ENCOUNTER — Emergency Department
Admission: EM | Admit: 2017-08-04 | Discharge: 2017-08-04 | Disposition: A | Discharge status: Home / Self Care | Payer: Medicare HMO | Attending: Emergency Medicine | Admitting: Emergency Medicine

## 2017-08-04 DIAGNOSIS — R5383 Other fatigue: Secondary | ICD-10-CM | POA: Diagnosis not present

## 2017-08-04 DIAGNOSIS — I440 Atrioventricular block, first degree: Secondary | ICD-10-CM | POA: Diagnosis not present

## 2017-08-04 DIAGNOSIS — Z87891 Personal history of nicotine dependence: Secondary | ICD-10-CM | POA: Insufficient documentation

## 2017-08-04 DIAGNOSIS — J449 Chronic obstructive pulmonary disease, unspecified: Secondary | ICD-10-CM | POA: Diagnosis not present

## 2017-08-04 DIAGNOSIS — Z79899 Other long term (current) drug therapy: Secondary | ICD-10-CM | POA: Diagnosis not present

## 2017-08-04 DIAGNOSIS — T148XXA Other injury of unspecified body region, initial encounter: Secondary | ICD-10-CM | POA: Diagnosis not present

## 2017-08-04 DIAGNOSIS — E860 Dehydration: Secondary | ICD-10-CM | POA: Diagnosis not present

## 2017-08-04 DIAGNOSIS — M7981 Nontraumatic hematoma of soft tissue: Secondary | ICD-10-CM | POA: Diagnosis not present

## 2017-08-04 DIAGNOSIS — M79652 Pain in left thigh: Secondary | ICD-10-CM | POA: Diagnosis not present

## 2017-08-04 DIAGNOSIS — R42 Dizziness and giddiness: Secondary | ICD-10-CM | POA: Diagnosis not present

## 2017-08-04 DIAGNOSIS — I10 Essential (primary) hypertension: Secondary | ICD-10-CM | POA: Insufficient documentation

## 2017-08-04 DIAGNOSIS — M79605 Pain in left leg: Secondary | ICD-10-CM | POA: Diagnosis not present

## 2017-08-04 DIAGNOSIS — S79922A Unspecified injury of left thigh, initial encounter: Secondary | ICD-10-CM | POA: Diagnosis not present

## 2017-08-04 LAB — CBC WITH DIFFERENTIAL/PLATELET
Basophils Absolute: 0.1 10*3/uL (ref 0–0.1)
Basophils Relative: 1 %
EOS ABS: 0.4 10*3/uL (ref 0–0.7)
Eosinophils Relative: 2 %
HCT: 36.4 % (ref 35.0–47.0)
Hemoglobin: 12.1 g/dL (ref 12.0–16.0)
LYMPHS ABS: 2.3 10*3/uL (ref 1.0–3.6)
Lymphocytes Relative: 13 %
MCH: 28.1 pg (ref 26.0–34.0)
MCHC: 33.2 g/dL (ref 32.0–36.0)
MCV: 84.6 fL (ref 80.0–100.0)
MONO ABS: 1.4 10*3/uL — AB (ref 0.2–0.9)
MONOS PCT: 8 %
Neutro Abs: 13.3 10*3/uL — ABNORMAL HIGH (ref 1.4–6.5)
Neutrophils Relative %: 76 %
PLATELETS: 404 10*3/uL (ref 150–440)
RBC: 4.3 MIL/uL (ref 3.80–5.20)
RDW: 15.2 % — AB (ref 11.5–14.5)
WBC: 17.5 10*3/uL — ABNORMAL HIGH (ref 3.6–11.0)

## 2017-08-04 LAB — COMPREHENSIVE METABOLIC PANEL
ALBUMIN: 3.8 g/dL (ref 3.5–5.0)
ALK PHOS: 62 U/L (ref 38–126)
ALT: 15 U/L (ref 14–54)
AST: 26 U/L (ref 15–41)
Anion gap: 9 (ref 5–15)
BILIRUBIN TOTAL: 0.7 mg/dL (ref 0.3–1.2)
BUN: 14 mg/dL (ref 6–20)
CALCIUM: 9.2 mg/dL (ref 8.9–10.3)
CO2: 28 mmol/L (ref 22–32)
Chloride: 92 mmol/L — ABNORMAL LOW (ref 101–111)
Creatinine, Ser: 1.12 mg/dL — ABNORMAL HIGH (ref 0.44–1.00)
GFR calc Af Amer: 57 mL/min — ABNORMAL LOW (ref >60)
GFR calc non Af Amer: 49 mL/min — ABNORMAL LOW (ref >60)
GLUCOSE: 104 mg/dL — AB (ref 65–99)
Potassium: 3.6 mmol/L (ref 3.5–5.1)
SODIUM: 129 mmol/L — AB (ref 135–145)
Total Protein: 6.4 g/dL — ABNORMAL LOW (ref 6.5–8.1)

## 2017-08-04 LAB — TROPONIN I: Troponin I: <0.03 ng/mL (ref <0.03)

## 2017-08-04 MED ORDER — SODIUM CHLORIDE 0.9 % IV BOLUS (SEPSIS)
1000.0000 mL | Freq: Once | INTRAVENOUS | Status: AC
  Administered 2017-08-04: 1000 mL via INTRAVENOUS

## 2017-08-04 MED ORDER — OXYCODONE-ACETAMINOPHEN 5-325 MG PO TABS
1.0000 | ORAL_TABLET | Freq: Once | ORAL | Status: AC
  Administered 2017-08-04: 1 via ORAL
  Filled 2017-08-04: qty 1

## 2017-08-04 NOTE — ED Notes (Signed)
Pt brought in via ems .  Pt denies headache, chest pain or sob.  Pt denies loc. Pt alert.   Pt has left thigh pain.  Pt ambulated after injury  Family with pt.

## 2017-08-04 NOTE — ED Triage Notes (Signed)
Pt brought in by ems from home with pain in left upper leg and dizziness. Pt fell against the banister today.  Hematoma to left upper leg.  Pt alert   md at bedside.

## 2017-08-04 NOTE — ED Provider Notes (Signed)
[facility] Emergency Department Provider Note  ____________________________________________   First MD Initiated Contact with Patient 08/04/17 1658     (approximate)  I have reviewed the triage vital signs and the nursing notes.   HISTORY  Chief Complaint Leg Injury and Dizziness   HPI Kelsey Long is a 71 y.o. female who comes to the emergency department via EMS after a brief episode of lightheadedness and weakness. She was standing outside her mobile home while her daughter-in-law smoked a cigarette and the patient stood up and began to feel lightheaded. She stumbled slightly and hit her left thigh and her left arm against a railing. She did not pass out. She denies headache. She denies chest pain shortness of breath abdominal pain nausea or vomiting. She denies double vision or blurred vision. Her daughter-in-law called 911 against the patient's wishes and she did not want to be transported.The patient was ambulatory on scene.   Past Medical History:  Diagnosis Date  . Anxiety   . Chronic pain   . COPD (chronic obstructive pulmonary disease) ([place])   . Hypertension   . Uterine cancer [facility])     Patient Active Problem List   Diagnosis Date Noted  . DOE (dyspnea on exertion) 09/24/2012    Past Surgical History:  Procedure Laterality Date  . GALLBLADDER SURGERY    . NECK SURGERY    . TOTAL ABDOMINAL HYSTERECTOMY      Prior to Admission medications   Medication Sig Start Date End Date Taking? Authorizing Provider  albuterol (PROVENTIL HFA;VENTOLIN HFA) 108 (90 BASE) MCG/ACT inhaler Inhale 2 puffs into the lungs every 6 (six) hours as needed for wheezing or shortness of breath.     [provider]  albuterol (PROVENTIL) (2.5 MG/3ML) 0.083% nebulizer solution Take 3 mLs (2.5 mg total) by nebulization every 6 (six) hours as needed for wheezing or shortness of breath. 05/02/15   Ruffian, III Luanna Cole, PA-C  alendronate (FOSAMAX) 70 MG  tablet Take 70 mg by mouth once a week. 04/30/15   [provider]  atorvastatin (LIPITOR) 20 MG tablet Take 20 mg by mouth daily.    [provider]  benazepril-hydrochlorthiazide (LOTENSIN HCT) 20-12.5 MG per tablet Take 1 tablet by mouth daily.    [provider]  budesonide-formoterol (SYMBICORT) 160-4.5 MCG/ACT inhaler Inhale 2 puffs into the lungs 2 (two) times daily.    [provider]  buPROPion (WELLBUTRIN SR) 150 MG 12 hr tablet Take 150 mg by mouth 2 (two) times daily.    [provider]  Cholecalciferol (VITAMIN D3) 2000 UNITS TABS Take 1 capsule by mouth daily.    [provider]  clindamycin (CLEOCIN) 300 MG capsule Take 1 capsule (300 mg total) by mouth 4 (four) times daily. 12/05/15   Beers, Pierce Crane, PA-C  cloNIDine (CATAPRES) 0.1 MG tablet Take 0.1 mg by mouth daily.     [provider]  doxycycline (VIBRAMYCIN) 100 MG capsule  02/03/16   [provider]  DULoxetine (CYMBALTA) 30 MG capsule Take 30 mg by mouth 2 (two) times daily.    [provider]  famotidine (PEPCID) 20 MG tablet Take 1 tablet (20 mg total) by mouth daily. 12/28/15 12/27/16  Triplett, Johnette Abraham B, FNP  gabapentin (NEURONTIN) 600 MG tablet Take 600 mg by mouth at bedtime.     [provider]  hydrOXYzine (ATARAX/VISTARIL) 25 MG tablet Take 1 tablet (25 mg total) by mouth every 6 (six) hours as needed. 12/28/15  Triplett, Cari B, FNP  metoprolol succinate (TOPROL-XL) 50 MG 24 hr tablet Take 50 mg by mouth daily. Take with or immediately following a meal.    [provider]  montelukast (SINGULAIR) 10 MG tablet Take 10 mg by mouth at bedtime.  09/19/12   [provider]  omeprazole (PRILOSEC) 20 MG capsule Take 20 mg by mouth daily.    [provider]  oxyCODONE-acetaminophen (PERCOCET) 10-325 MG tablet Take 1 tablet by mouth every 4 (four) hours as needed for pain.    [provider]  potassium chloride  (K-DUR) 10 MEQ tablet Take 10 mEq by mouth daily.    [provider]  potassium chloride (K-DUR,KLOR-CON) 10 MEQ tablet  02/07/16   [provider]  predniSONE (STERAPRED UNI-PAK 21 TAB) 10 MG (21) TBPK tablet Take 6 tablets on day 1 Take 5 tablets on day 2 Take 4 tablets on day 3 Take 3 tablets on day 4 Take 2 tablets on day 5 Take 1 tablet on day 6 12/28/15   Triplett, Cari B, FNP  tiotropium (SPIRIVA) 18 MCG inhalation capsule Place 18 mcg into inhaler and inhale daily.    [provider]  Vitamin D, Ergocalciferol, (DRISDOL) 50000 UNITS CAPS capsule Take 50,000 Units by mouth every 7 (seven) days.    [provider]    Allergies Penicillins; Sulfa drugs cross reactors; Tetracyclines & related; and Tramadol  Family History  Problem Relation Age of Onset  . Emphysema Mother   . Cancer Mother   . Heart failure Father   . Emphysema Maternal Uncle   . Asthma Unknown        daughter  . Heart failure Maternal Grandmother     Social History Social History  Substance Use Topics  . Smoking status: Former Smoker    Packs/day: 1.00    Years: 40.00    Quit date: 12/24/2009  . Smokeless tobacco: Never Used  . Alcohol use Yes     Comment: rare    Review of Systems Constitutional: No fever/chills Eyes: No visual changes. ENT: No sore throat. Cardiovascular: Denies chest pain. Respiratory: Denies shortness of breath. Gastrointestinal: No abdominal pain.  No nausea, no vomiting.  No diarrhea.  No constipation. Genitourinary: Negative for dysuria. Musculoskeletal: Negative for back pain. Skin: Negative for rash. Neurological: Negative for headaches, focal weakness or numbness.   ____________________________________________   PHYSICAL EXAM:  VITAL SIGNS: ED Triage Vitals  Enc Vitals Group     BP 08/04/17 1654 115/78     Pulse Rate 08/04/17 1654 82     Resp 08/04/17 1654 20     Temp 08/04/17 1654 98.6 F (37 C)     Temp Source 08/04/17  1654 Oral     SpO2 08/04/17 1654 95 %     Weight 08/04/17 1655 113 lb (51.3 kg)     Height 08/04/17 1655 5\' 2"  (1.575 m)     Head Circumference --      Peak Flow --      Pain Score 08/04/17 1654 5     Pain Loc --      Pain Edu? --      Excl. in [place]? --     Constitutional: Alert and oriented 4 smiling joking laughing very well-appearing nontoxic no diaphoresis speaks in full clear sentences Eyes: PERRL EOMI. Head: Atraumatic. Nose: No congestion/rhinnorhea. Mouth/Throat: No trismus Neck: No stridor.   Cardiovascular: Normal rate, regular rhythm. Grossly normal heart sounds.  Good peripheral circulation. Respiratory: Normal  respiratory effort.  No retractions. Lungs CTAB and moving good air Gastrointestinal: Soft nontender Musculoskeletal: No lower extremity edema   Neurologic:  Normal speech and language. No gross focal neurologic deficits are appreciated. Skin:  Hematoma noted to left upper lateral thigh and abrasion across dorsal aspect of left forearm Psychiatric: Mood and affect are normal. Speech and behavior are normal.    ____________________________________________   DIFFERENTIAL includes but not limited to  Cardiogenic syncope, vasovagal syncope, dehydration, metabolic derangement, infection, fracture, dislocation ____________________________________________   LABS (all labs ordered are listed, but only abnormal results are displayed)  Labs Reviewed  COMPREHENSIVE METABOLIC PANEL - Abnormal; Notable for the following:       Result Value   Sodium 129 (*)    Chloride 92 (*)    Glucose, Bld 104 (*)    Creatinine, Ser 1.12 (*)    Total Protein 6.4 (*)    GFR calc non Af Amer 49 (*)    GFR calc Af Amer 57 (*)    All other components within normal limits  CBC WITH DIFFERENTIAL/PLATELET - Abnormal; Notable for the following:    WBC 17.5 (*)    RDW 15.2 (*)    Neutro Abs 13.3 (*)    Monocytes Absolute 1.4 (*)    All other components within normal limits    TROPONIN I    Hypochloremic hyponatremia most suggestive of dehydration elevated white count is nonspecific and likely secondary to stress __________________________________________  EKG  ED ECG REPORT I, Darel Hong, the attending physician, personally viewed and interpreted this ECG.  Date: 08/04/2017 Rate: 78 Rhythm: normal sinus rhythm QRS Axis: normal Intervals: First-degree AV block ST/T Wave abnormalities: normal Narrative Interpretation: First-degree AV block poor R-wave progression but no signs of acute ischemia abnormal EKG  ____________________________________________  RADIOLOGY  Chest x-ray with mild atelectasis femur x-ray negative ____________________________________________   PROCEDURES  Procedure(s) performed: no  Procedures  Critical Care performed: no  Observation: no ____________________________________________   INITIAL IMPRESSION / ASSESSMENT AND PLAN / ED COURSE  Pertinent labs & imaging results that were available during my care of the patient were reviewed by me and considered in my medical decision making (see chart for details).  The patient arrives hemodynamically stable well appearing. She is not shortened and she is neurovascularly intact. X-rays obtained which are fortunately negative for fracture. Her discomfort is likely secondary to a hematoma versus a contusion. Advised symptomatic treatment with RICE and referral back to primary care. She is discharged home in good condition.      ____________________________________________   FINAL CLINICAL IMPRESSION(S) / ED DIAGNOSES  Final diagnoses:  Dehydration  Fatigue, unspecified type  Hematoma      NEW MEDICATIONS STARTED DURING THIS VISIT:  Discharge Medication List as of 08/04/2017  6:01 PM       Note:  This document was prepared using Dragon voice recognition software and may include unintentional dictation errors.     Darel Hong, MD 08/05/17 1430

## 2017-08-04 NOTE — Discharge Instructions (Addendum)
Fortunately today you did not have any broken bones in your blood work was reassuring except for some mild dehydration. Please make an appointment to follow-up with your primary care physician in the next week or so for recheck and return to the emergency department for any concerns.  It was a pleasure to take care of you today, and thank you for coming to our emergency department.  If you have any questions or concerns before leaving please ask the nurse to grab me and I'm more than happy to go through your aftercare instructions again.  If you were prescribed any opioid pain medication today such as Norco, Vicodin, Percocet, morphine, hydrocodone, or oxycodone please make sure you do not drive when you are taking this medication as it can alter your ability to drive safely.  If you have any concerns once you are home that you are not improving or are in fact getting worse before you can make it to your follow-up appointment, please do not hesitate to call 911 and come back for further evaluation.  Darel Hong, MD  Results for orders placed or performed during the hospital encounter of 08/04/17  Comprehensive metabolic panel  Result Value Ref Range   Sodium 129 (L) 135 - 145 mmol/L   Potassium 3.6 3.5 - 5.1 mmol/L   Chloride 92 (L) 101 - 111 mmol/L   CO2 28 22 - 32 mmol/L   Glucose, Bld 104 (H) 65 - 99 mg/dL   BUN 14 6 - 20 mg/dL   Creatinine, Ser 1.12 (H) 0.44 - 1.00 mg/dL   Calcium 9.2 8.9 - 10.3 mg/dL   Total Protein 6.4 (L) 6.5 - 8.1 g/dL   Albumin 3.8 3.5 - 5.0 g/dL   AST 26 15 - 41 U/L   ALT 15 14 - 54 U/L   Alkaline Phosphatase 62 38 - 126 U/L   Total Bilirubin 0.7 0.3 - 1.2 mg/dL   GFR calc non Af Amer 49 (L) >60 mL/min   GFR calc Af Amer 57 (L) >60 mL/min   Anion gap 9 5 - 15  Troponin I  Result Value Ref Range   Troponin I <0.03 <0.03 ng/mL  CBC with Differential  Result Value Ref Range   WBC 17.5 (H) 3.6 - 11.0 K/uL   RBC 4.30 3.80 - 5.20 MIL/uL   Hemoglobin 12.1  12.0 - 16.0 g/dL   HCT 36.4 35.0 - 47.0 %   MCV 84.6 80.0 - 100.0 fL   MCH 28.1 26.0 - 34.0 pg   MCHC 33.2 32.0 - 36.0 g/dL   RDW 15.2 (H) 11.5 - 14.5 %   Platelets 404 150 - 440 K/uL   Neutrophils Relative % 76 %   Neutro Abs 13.3 (H) 1.4 - 6.5 K/uL   Lymphocytes Relative 13 %   Lymphs Abs 2.3 1.0 - 3.6 K/uL   Monocytes Relative 8 %   Monocytes Absolute 1.4 (H) 0.2 - 0.9 K/uL   Eosinophils Relative 2 %   Eosinophils Absolute 0.4 0 - 0.7 K/uL   Basophils Relative 1 %   Basophils Absolute 0.1 0 - 0.1 K/uL   Dg Chest Port 1 View  Result Date: 08/04/2017 CLINICAL DATA:  Dizziness. EXAM: PORTABLE CHEST 1 VIEW COMPARISON:  May 09, 2015 FINDINGS: There is a probable calcified nodule in the right apex, unchanged. No pneumothorax. The cardiomediastinal silhouette is stable. Mild linear opacity in the medial right lung base may represent atelectasis. Early infiltrate considered less likely. No suspicious nodules or  masses. No other acute abnormalities identified. IMPRESSION: Linear opacity in the medial right lung base is most likely atelectasis. Infiltrate considered less likely. No other acute abnormalities. Electronically Signed   By: Dorise Bullion III M.D   On: 08/04/2017 17:51   Dg Femur Portable Min 2 Views Left  Result Date: 08/04/2017 CLINICAL DATA:  [place].  Left leg pain. EXAM: LEFT FEMUR PORTABLE 2 VIEWS COMPARISON:  None. FINDINGS: The left hip and knee joints are maintained. No acute femur fracture is identified. The visualized left hemipelvis appears intact. The pubic symphysis and left SI joint appear normal. IMPRESSION: No acute fracture. Electronically Signed   By: Marijo Sanes M.D.   On: 08/04/2017 17:48

## 2017-08-07 DIAGNOSIS — M545 Low back pain: Secondary | ICD-10-CM | POA: Diagnosis not present

## 2017-08-07 DIAGNOSIS — G894 Chronic pain syndrome: Secondary | ICD-10-CM | POA: Diagnosis not present

## 2017-08-07 DIAGNOSIS — M542 Cervicalgia: Secondary | ICD-10-CM | POA: Diagnosis not present

## 2017-08-07 DIAGNOSIS — Z79891 Long term (current) use of opiate analgesic: Secondary | ICD-10-CM | POA: Diagnosis not present

## 2017-08-07 DIAGNOSIS — M79605 Pain in left leg: Secondary | ICD-10-CM | POA: Diagnosis not present

## 2017-08-22 DIAGNOSIS — N390 Urinary tract infection, site not specified: Secondary | ICD-10-CM | POA: Diagnosis not present

## 2017-08-22 DIAGNOSIS — I10 Essential (primary) hypertension: Secondary | ICD-10-CM | POA: Diagnosis not present

## 2017-08-22 DIAGNOSIS — Z1329 Encounter for screening for other suspected endocrine disorder: Secondary | ICD-10-CM | POA: Diagnosis not present

## 2017-08-22 DIAGNOSIS — J449 Chronic obstructive pulmonary disease, unspecified: Secondary | ICD-10-CM | POA: Diagnosis not present

## 2017-08-22 DIAGNOSIS — E119 Type 2 diabetes mellitus without complications: Secondary | ICD-10-CM | POA: Diagnosis not present

## 2017-08-22 DIAGNOSIS — M545 Low back pain: Secondary | ICD-10-CM | POA: Diagnosis not present

## 2017-08-22 DIAGNOSIS — M791 Myalgia: Secondary | ICD-10-CM | POA: Diagnosis not present

## 2017-08-22 DIAGNOSIS — M150 Primary generalized (osteo)arthritis: Secondary | ICD-10-CM | POA: Diagnosis not present

## 2017-08-22 DIAGNOSIS — Z9181 History of falling: Secondary | ICD-10-CM | POA: Diagnosis not present

## 2017-08-28 ENCOUNTER — Other Ambulatory Visit: Payer: Self-pay | Admitting: Adult Health

## 2017-08-28 ENCOUNTER — Ambulatory Visit
Admission: RE | Admit: 2017-08-28 | Discharge: 2017-08-28 | Disposition: A | Discharge status: Home / Self Care | Payer: Medicare HMO | Source: Ambulatory Visit | Attending: Adult Health | Admitting: Adult Health

## 2017-08-28 DIAGNOSIS — M25559 Pain in unspecified hip: Secondary | ICD-10-CM | POA: Diagnosis not present

## 2017-08-28 DIAGNOSIS — M25551 Pain in right hip: Secondary | ICD-10-CM

## 2017-08-28 DIAGNOSIS — M25552 Pain in left hip: Secondary | ICD-10-CM | POA: Diagnosis not present

## 2017-08-28 DIAGNOSIS — S79911A Unspecified injury of right hip, initial encounter: Secondary | ICD-10-CM | POA: Diagnosis not present

## 2017-08-28 DIAGNOSIS — S3993XA Unspecified injury of pelvis, initial encounter: Secondary | ICD-10-CM | POA: Diagnosis not present

## 2017-08-28 DIAGNOSIS — S79912A Unspecified injury of left hip, initial encounter: Secondary | ICD-10-CM | POA: Diagnosis not present

## 2017-09-03 DIAGNOSIS — R300 Dysuria: Secondary | ICD-10-CM | POA: Diagnosis not present

## 2017-09-09 DIAGNOSIS — G894 Chronic pain syndrome: Secondary | ICD-10-CM | POA: Diagnosis not present

## 2017-09-09 DIAGNOSIS — M542 Cervicalgia: Secondary | ICD-10-CM | POA: Diagnosis not present

## 2017-09-09 DIAGNOSIS — M545 Low back pain: Secondary | ICD-10-CM | POA: Diagnosis not present

## 2017-09-09 DIAGNOSIS — M79605 Pain in left leg: Secondary | ICD-10-CM | POA: Diagnosis not present

## 2017-09-09 DIAGNOSIS — Z79891 Long term (current) use of opiate analgesic: Secondary | ICD-10-CM | POA: Diagnosis not present

## 2017-10-07 DIAGNOSIS — G894 Chronic pain syndrome: Secondary | ICD-10-CM | POA: Diagnosis not present

## 2017-10-07 DIAGNOSIS — M545 Low back pain: Secondary | ICD-10-CM | POA: Diagnosis not present

## 2017-10-07 DIAGNOSIS — M79605 Pain in left leg: Secondary | ICD-10-CM | POA: Diagnosis not present

## 2017-10-07 DIAGNOSIS — Z79891 Long term (current) use of opiate analgesic: Secondary | ICD-10-CM | POA: Diagnosis not present

## 2017-10-07 DIAGNOSIS — M542 Cervicalgia: Secondary | ICD-10-CM | POA: Diagnosis not present

## 2017-11-06 DIAGNOSIS — M542 Cervicalgia: Secondary | ICD-10-CM | POA: Diagnosis not present

## 2017-11-06 DIAGNOSIS — M545 Low back pain: Secondary | ICD-10-CM | POA: Diagnosis not present

## 2017-11-06 DIAGNOSIS — G894 Chronic pain syndrome: Secondary | ICD-10-CM | POA: Diagnosis not present

## 2017-11-06 DIAGNOSIS — M79605 Pain in left leg: Secondary | ICD-10-CM | POA: Diagnosis not present

## 2017-11-06 DIAGNOSIS — Z79891 Long term (current) use of opiate analgesic: Secondary | ICD-10-CM | POA: Diagnosis not present

## 2017-11-11 ENCOUNTER — Encounter: Payer: Self-pay | Admitting: *Deleted

## 2017-11-11 ENCOUNTER — Emergency Department: Payer: Medicare HMO

## 2017-11-11 ENCOUNTER — Other Ambulatory Visit: Payer: Self-pay

## 2017-11-11 ENCOUNTER — Inpatient Hospital Stay
Admission: EM | Admit: 2017-11-11 | Discharge: 2017-11-15 | DRG: 871 | Disposition: A | Discharge status: Home Health | Payer: Medicare HMO | Attending: Internal Medicine | Admitting: Internal Medicine

## 2017-11-11 DIAGNOSIS — G9341 Metabolic encephalopathy: Secondary | ICD-10-CM | POA: Diagnosis not present

## 2017-11-11 DIAGNOSIS — E119 Type 2 diabetes mellitus without complications: Secondary | ICD-10-CM | POA: Diagnosis present

## 2017-11-11 DIAGNOSIS — E878 Other disorders of electrolyte and fluid balance, not elsewhere classified: Secondary | ICD-10-CM | POA: Diagnosis present

## 2017-11-11 DIAGNOSIS — J9601 Acute respiratory failure with hypoxia: Secondary | ICD-10-CM | POA: Diagnosis not present

## 2017-11-11 DIAGNOSIS — Z90710 Acquired absence of both cervix and uterus: Secondary | ICD-10-CM | POA: Diagnosis not present

## 2017-11-11 DIAGNOSIS — D649 Anemia, unspecified: Secondary | ICD-10-CM | POA: Diagnosis present

## 2017-11-11 DIAGNOSIS — N179 Acute kidney failure, unspecified: Secondary | ICD-10-CM | POA: Diagnosis not present

## 2017-11-11 DIAGNOSIS — I10 Essential (primary) hypertension: Secondary | ICD-10-CM | POA: Diagnosis present

## 2017-11-11 DIAGNOSIS — Z8542 Personal history of malignant neoplasm of other parts of uterus: Secondary | ICD-10-CM | POA: Diagnosis not present

## 2017-11-11 DIAGNOSIS — E876 Hypokalemia: Secondary | ICD-10-CM | POA: Diagnosis present

## 2017-11-11 DIAGNOSIS — J189 Pneumonia, unspecified organism: Secondary | ICD-10-CM | POA: Diagnosis present

## 2017-11-11 DIAGNOSIS — J440 Chronic obstructive pulmonary disease with acute lower respiratory infection: Secondary | ICD-10-CM | POA: Diagnosis not present

## 2017-11-11 DIAGNOSIS — R42 Dizziness and giddiness: Secondary | ICD-10-CM | POA: Diagnosis not present

## 2017-11-11 DIAGNOSIS — I959 Hypotension, unspecified: Secondary | ICD-10-CM | POA: Diagnosis present

## 2017-11-11 DIAGNOSIS — F419 Anxiety disorder, unspecified: Secondary | ICD-10-CM | POA: Diagnosis present

## 2017-11-11 DIAGNOSIS — G8929 Other chronic pain: Secondary | ICD-10-CM | POA: Diagnosis present

## 2017-11-11 DIAGNOSIS — A409 Streptococcal sepsis, unspecified: Principal | ICD-10-CM | POA: Diagnosis present

## 2017-11-11 DIAGNOSIS — Z87891 Personal history of nicotine dependence: Secondary | ICD-10-CM | POA: Diagnosis not present

## 2017-11-11 DIAGNOSIS — R05 Cough: Secondary | ICD-10-CM | POA: Diagnosis not present

## 2017-11-11 DIAGNOSIS — E860 Dehydration: Secondary | ICD-10-CM | POA: Diagnosis present

## 2017-11-11 DIAGNOSIS — E871 Hypo-osmolality and hyponatremia: Secondary | ICD-10-CM | POA: Diagnosis present

## 2017-11-11 DIAGNOSIS — J181 Lobar pneumonia, unspecified organism: Secondary | ICD-10-CM

## 2017-11-11 DIAGNOSIS — Z7951 Long term (current) use of inhaled steroids: Secondary | ICD-10-CM | POA: Diagnosis not present

## 2017-11-11 DIAGNOSIS — A419 Sepsis, unspecified organism: Secondary | ICD-10-CM | POA: Diagnosis not present

## 2017-11-11 LAB — BLOOD GAS, VENOUS
ACID-BASE DEFICIT: 4.1 mmol/L — AB (ref 0.0–2.0)
BICARBONATE: 22.6 mmol/L (ref 20.0–28.0)
O2 Saturation: UNDETERMINED %
PH VEN: 7.29 (ref 7.250–7.430)
PO2 VEN: UNDETERMINED mmHg (ref 32.0–45.0)
Patient temperature: 37
pCO2, Ven: 47 mmHg (ref 44.0–60.0)

## 2017-11-11 LAB — TROPONIN I: Troponin I: <0.03 ng/mL (ref <0.03)

## 2017-11-11 LAB — CBC
HCT: 30.5 % — ABNORMAL LOW (ref 35.0–47.0)
Hemoglobin: 10.4 g/dL — ABNORMAL LOW (ref 12.0–16.0)
MCH: 29.4 pg (ref 26.0–34.0)
MCHC: 34.1 g/dL (ref 32.0–36.0)
MCV: 86.3 fL (ref 80.0–100.0)
PLATELETS: 323 10*3/uL (ref 150–440)
RBC: 3.53 MIL/uL — AB (ref 3.80–5.20)
RDW: 14.5 % (ref 11.5–14.5)
WBC: 16 10*3/uL — AB (ref 3.6–11.0)

## 2017-11-11 LAB — PROTIME-INR
INR: 1.14
Prothrombin Time: 14.5 seconds (ref 11.4–15.2)

## 2017-11-11 LAB — URINALYSIS, COMPLETE (UACMP) WITH MICROSCOPIC
Bacteria, UA: NONE SEEN
Bilirubin Urine: NEGATIVE
GLUCOSE, UA: NEGATIVE mg/dL
KETONES UR: NEGATIVE mg/dL
LEUKOCYTES UA: NEGATIVE
Nitrite: NEGATIVE
Protein, ur: 30 mg/dL — AB
SPECIFIC GRAVITY, URINE: 1.011 (ref 1.005–1.030)
pH: 5 (ref 5.0–8.0)

## 2017-11-11 LAB — BASIC METABOLIC PANEL
Anion gap: 12 (ref 5–15)
BUN: 60 mg/dL — AB (ref 6–20)
CO2: 22 mmol/L (ref 22–32)
CREATININE: 1.74 mg/dL — AB (ref 0.44–1.00)
Calcium: 8.5 mg/dL — ABNORMAL LOW (ref 8.9–10.3)
Chloride: 91 mmol/L — ABNORMAL LOW (ref 101–111)
GFR calc non Af Amer: 29 mL/min — ABNORMAL LOW (ref >60)
GFR, EST AFRICAN AMERICAN: 33 mL/min — AB (ref >60)
Glucose, Bld: 126 mg/dL — ABNORMAL HIGH (ref 65–99)
Potassium: 3.1 mmol/L — ABNORMAL LOW (ref 3.5–5.1)
SODIUM: 125 mmol/L — AB (ref 135–145)

## 2017-11-11 LAB — PROCALCITONIN: Procalcitonin: 14.8 ng/mL

## 2017-11-11 LAB — LACTIC ACID, PLASMA
LACTIC ACID, VENOUS: 1.9 mmol/L (ref 0.5–1.9)
Lactic Acid, Venous: 2.2 mmol/L (ref 0.5–1.9)

## 2017-11-11 LAB — APTT: aPTT: 40 seconds — ABNORMAL HIGH (ref 24–36)

## 2017-11-11 MED ORDER — BUPROPION HCL ER (SR) 150 MG PO TB12
150.0000 mg | ORAL_TABLET | Freq: Two times a day (BID) | ORAL | Status: DC
  Administered 2017-11-11 – 2017-11-12 (×3): 150 mg via ORAL
  Filled 2017-11-11 (×4): qty 1

## 2017-11-11 MED ORDER — OXYCODONE HCL 5 MG PO TABS
10.0000 mg | ORAL_TABLET | Freq: Three times a day (TID) | ORAL | Status: DC | PRN
  Administered 2017-11-11: 17:00:00 10 mg via ORAL
  Filled 2017-11-11: qty 2

## 2017-11-11 MED ORDER — OXYBUTYNIN CHLORIDE 5 MG PO TABS
2.5000 mg | ORAL_TABLET | Freq: Two times a day (BID) | ORAL | Status: DC
  Administered 2017-11-11 – 2017-11-15 (×8): 2.5 mg via ORAL
  Filled 2017-11-11: qty 0.5
  Filled 2017-11-11 (×2): qty 1
  Filled 2017-11-11: qty 0.5
  Filled 2017-11-11: qty 1
  Filled 2017-11-11 (×5): qty 0.5
  Filled 2017-11-11 (×3): qty 1
  Filled 2017-11-11: qty 0.5
  Filled 2017-11-11: qty 1
  Filled 2017-11-11: qty 0.5
  Filled 2017-11-11: qty 1

## 2017-11-11 MED ORDER — HYDROXYZINE HCL 25 MG PO TABS
25.0000 mg | ORAL_TABLET | Freq: Four times a day (QID) | ORAL | Status: DC | PRN
  Filled 2017-11-11: qty 1

## 2017-11-11 MED ORDER — ACETAMINOPHEN 325 MG PO TABS: 325.0000 mg | ORAL_TABLET | Freq: Three times a day (TID) | ORAL | Status: DC | PRN

## 2017-11-11 MED ORDER — OXYCODONE-ACETAMINOPHEN 10-325 MG PO TABS: 1.0000 | ORAL_TABLET | Freq: Three times a day (TID) | ORAL | Status: DC | PRN

## 2017-11-11 MED ORDER — VITAMIN D 1000 UNITS PO TABS
2000.0000 [IU] | ORAL_TABLET | Freq: Every day | ORAL | Status: DC
  Administered 2017-11-11 – 2017-11-15 (×5): 2000 [IU] via ORAL
  Filled 2017-11-11 (×5): qty 2

## 2017-11-11 MED ORDER — TIOTROPIUM BROMIDE MONOHYDRATE 18 MCG IN CAPS
18.0000 ug | ORAL_CAPSULE | Freq: Every day | RESPIRATORY_TRACT | Status: DC
  Administered 2017-11-11 – 2017-11-15 (×5): 18 ug via RESPIRATORY_TRACT
  Filled 2017-11-11: qty 5

## 2017-11-11 MED ORDER — POTASSIUM CHLORIDE IN NACL 40-0.9 MEQ/L-% IV SOLN
INTRAVENOUS | Status: DC
  Administered 2017-11-11 – 2017-11-12 (×3): 125 mL/h via INTRAVENOUS
  Filled 2017-11-11 (×5): qty 1000

## 2017-11-11 MED ORDER — PANTOPRAZOLE SODIUM 40 MG PO TBEC
40.0000 mg | DELAYED_RELEASE_TABLET | Freq: Every day | ORAL | Status: DC
  Administered 2017-11-11 – 2017-11-15 (×5): 40 mg via ORAL
  Filled 2017-11-11 (×5): qty 1

## 2017-11-11 MED ORDER — DEXTROSE 5 % IV SOLN
1.0000 g | INTRAVENOUS | Status: DC
  Administered 2017-11-11: 1 g via INTRAVENOUS
  Filled 2017-11-11 (×2): qty 10

## 2017-11-11 MED ORDER — ALENDRONATE SODIUM 70 MG PO TABS: 70.0000 mg | ORAL_TABLET | ORAL | Status: DC

## 2017-11-11 MED ORDER — DEXTROSE 5 % IV SOLN
2.0000 g | Freq: Once | INTRAVENOUS | Status: AC
  Administered 2017-11-11: 2 g via INTRAVENOUS
  Filled 2017-11-11: qty 2

## 2017-11-11 MED ORDER — VANCOMYCIN HCL IN DEXTROSE 1-5 GM/200ML-% IV SOLN
1000.0000 mg | Freq: Once | INTRAVENOUS | Status: AC
  Administered 2017-11-11: 1000 mg via INTRAVENOUS
  Filled 2017-11-11: qty 200

## 2017-11-11 MED ORDER — DULOXETINE HCL 30 MG PO CPEP
30.0000 mg | ORAL_CAPSULE | Freq: Two times a day (BID) | ORAL | Status: DC
Start: 2017-11-11 — End: 2017-11-15
  Administered 2017-11-11 – 2017-11-15 (×9): 30 mg via ORAL
  Filled 2017-11-11 (×9): qty 1

## 2017-11-11 MED ORDER — SIMVASTATIN 20 MG PO TABS: 20.0000 mg | ORAL_TABLET | Freq: Every day | ORAL | Status: DC

## 2017-11-11 MED ORDER — MOMETASONE FURO-FORMOTEROL FUM 200-5 MCG/ACT IN AERO
2.0000 | INHALATION_SPRAY | Freq: Two times a day (BID) | RESPIRATORY_TRACT | Status: DC
  Administered 2017-11-11 – 2017-11-15 (×8): 2 via RESPIRATORY_TRACT
  Filled 2017-11-11: qty 8.8

## 2017-11-11 MED ORDER — SODIUM CHLORIDE 0.9 % IV SOLN
Freq: Once | INTRAVENOUS | Status: AC
  Administered 2017-11-11: 12:00:00 via INTRAVENOUS

## 2017-11-11 MED ORDER — SODIUM CHLORIDE 0.9 % IV BOLUS (SEPSIS): 500.0000 mL | Freq: Once | INTRAVENOUS | Status: DC

## 2017-11-11 MED ORDER — ATORVASTATIN CALCIUM 20 MG PO TABS
20.0000 mg | ORAL_TABLET | Freq: Every day | ORAL | Status: DC
Start: 2017-11-11 — End: 2017-11-15
  Administered 2017-11-11 – 2017-11-15 (×5): 20 mg via ORAL
  Filled 2017-11-11 (×5): qty 1

## 2017-11-11 MED ORDER — MONTELUKAST SODIUM 10 MG PO TABS
10.0000 mg | ORAL_TABLET | Freq: Every day | ORAL | Status: DC
  Administered 2017-11-11 – 2017-11-14 (×4): 10 mg via ORAL
  Filled 2017-11-11 (×4): qty 1

## 2017-11-11 MED ORDER — POTASSIUM CHLORIDE CRYS ER 10 MEQ PO TBCR
10.0000 meq | EXTENDED_RELEASE_TABLET | Freq: Every day | ORAL | Status: DC
  Administered 2017-11-11 – 2017-11-15 (×5): 10 meq via ORAL
  Filled 2017-11-11 (×5): qty 1

## 2017-11-11 MED ORDER — ALBUTEROL SULFATE (2.5 MG/3ML) 0.083% IN NEBU: 3.0000 mL | INHALATION_SOLUTION | Freq: Four times a day (QID) | RESPIRATORY_TRACT | Status: DC | PRN

## 2017-11-11 MED ORDER — SODIUM CHLORIDE 0.9 % IV BOLUS (SEPSIS): 1000.0000 mL | Freq: Once | INTRAVENOUS | Status: DC

## 2017-11-11 MED ORDER — DEXTROSE 5 % IV SOLN
500.0000 mg | INTRAVENOUS | Status: DC
  Administered 2017-11-11 – 2017-11-14 (×4): 500 mg via INTRAVENOUS
  Filled 2017-11-11 (×5): qty 500

## 2017-11-11 MED ORDER — GABAPENTIN 600 MG PO TABS
600.0000 mg | ORAL_TABLET | Freq: Every day | ORAL | Status: DC
  Administered 2017-11-11 – 2017-11-12 (×2): 600 mg via ORAL
  Filled 2017-11-11 (×2): qty 1

## 2017-11-11 NOTE — ED Triage Notes (Signed)
Pt states feeling dizzy for 1 week, states feeling shaky inside, denies any pain, pt states occasional double vision, awake and alert in no acute distress

## 2017-11-11 NOTE — Progress Notes (Signed)

## 2017-11-11 NOTE — H&P (Signed)
[facility] NAME: Kelsey Long    MR#:  [id number]  DATE OF BIRTH:  05/26/1946  DATE OF ADMISSION:  11/11/2017  PRIMARY CARE PHYSICIAN: Alvester Chou, NP   REQUESTING/REFERRING PHYSICIAN:   CHIEF COMPLAINT:   Chief Complaint  Patient presents with  . Dizziness    HISTORY OF PRESENT ILLNESS: Kelsey Long  is a 71 y.o. female with a known history per below presented to the emergency room with not feeling well, dizziness, lightheadedness, productive cough over the last several days noted with blood streaks, in the emergency room patient was noted to be hypotensive, elevated lactic acid level, potassium 3.1, sodium 125, chloride 91, creatinine 1.7, BUN 60, white count 16,000, chest x-ray with right lower lobe pneumonia noted, patient started on sepsis protocol, patient evaluated emergency room, no apparent distress, family at the bedside, patient will be admitted for acute sepsis secondary to recommend acquired pneumonia with acute kidney injury/dehydration.  PAST MEDICAL HISTORY:   Past Medical History:  Diagnosis Date  . Anxiety   . Chronic pain   . COPD (chronic obstructive pulmonary disease) ([place])   . Hypertension   . Uterine cancer ([place])     PAST SURGICAL HISTORY:  Past Surgical History:  Procedure Laterality Date  . GALLBLADDER SURGERY    . NECK SURGERY    . TOTAL ABDOMINAL HYSTERECTOMY      SOCIAL HISTORY:  Social History   Tobacco Use  . Smoking status: Former Smoker    Packs/day: 1.00    Years: 40.00    Pack years: 40.00    Last attempt to quit: 12/24/2009    Years since quitting: 7.8  . Smokeless tobacco: Never Used  Substance Use Topics  . Alcohol use: Yes    Comment: rare    FAMILY HISTORY:  Family History  Problem Relation Age of Onset  . Emphysema Mother   . Cancer Mother   . Heart failure Father   . Emphysema Maternal Uncle   . Asthma Unknown        daughter  . Heart failure Maternal  Grandmother     DRUG ALLERGIES:  Allergies  Allergen Reactions  . Penicillins Swelling    Has patient had a PCN reaction causing immediate rash, facial/tongue/throat swelling, SOB or lightheadedness with hypotension: No Has patient had a PCN reaction causing severe rash involving mucus membranes or skin necrosis: No Has patient had a PCN reaction that required hospitalization: No Has patient had a PCN reaction occurring within the last 10 years: No If all of the above answers are "NO", then may proceed with Cephalosporin use.   . Sulfa Drugs Cross Reactors Swelling  . Tetracyclines & Related Swelling  . Tramadol Rash    REVIEW OF SYSTEMS:   CONSTITUTIONAL: +fever/fatigue/weakness.  EYES: No blurred or double vision.  EARS, NOSE, AND THROAT: No tinnitus or ear pain.  RESPIRATORY: +cough/shortness of breath, nowheezing, + hemoptysis.  CARDIOVASCULAR: No chest pain, orthopnea, edema.  GASTROINTESTINAL: No nausea, vomiting, diarrhea or abdominal pain.  GENITOURINARY: No dysuria, hematuria.  ENDOCRINE: No polyuria, nocturia,  HEMATOLOGY: No anemia, easy bruising or bleeding SKIN: No rash or lesion. MUSCULOSKELETAL: No joint pain or arthritis.   NEUROLOGIC: No tingling, numbness, weakness.  PSYCHIATRY: No anxiety or depression.   MEDICATIONS AT HOME:  Prior to Admission medications   Medication Sig Start Date End Date Taking? Authorizing Provider  albuterol (PROVENTIL HFA;VENTOLIN HFA) 108 (90 BASE) MCG/ACT inhaler Inhale 2 puffs into the  lungs every 6 (six) hours as needed for wheezing or shortness of breath.     [provider]  albuterol (PROVENTIL) (2.5 MG/3ML) 0.083% nebulizer solution Take 3 mLs (2.5 mg total) by nebulization every 6 (six) hours as needed for wheezing or shortness of breath. 05/02/15   Ruffian, III Luanna Cole, PA-C  alendronate (FOSAMAX) 70 MG tablet Take 70 mg by mouth once a week. 04/30/15   [provider]  atorvastatin (LIPITOR) 20 MG tablet  Take 20 mg by mouth daily.    [provider]  benazepril-hydrochlorthiazide (LOTENSIN HCT) 20-12.5 MG per tablet Take 1 tablet by mouth daily.    [provider]  budesonide-formoterol (SYMBICORT) 160-4.5 MCG/ACT inhaler Inhale 2 puffs into the lungs 2 (two) times daily.    [provider]  buPROPion (WELLBUTRIN SR) 150 MG 12 hr tablet Take 150 mg by mouth 2 (two) times daily.    [provider]  Cholecalciferol (VITAMIN D3) 2000 UNITS TABS Take 1 capsule by mouth daily.    [provider]  clindamycin (CLEOCIN) 300 MG capsule Take 1 capsule (300 mg total) by mouth 4 (four) times daily. 12/05/15   Beers, Pierce Crane, PA-C  cloNIDine (CATAPRES) 0.1 MG tablet Take 0.1 mg by mouth daily.     [provider]  doxycycline (VIBRAMYCIN) 100 MG capsule  02/03/16   [provider]  DULoxetine (CYMBALTA) 30 MG capsule Take 30 mg by mouth 2 (two) times daily.    [provider]  famotidine (PEPCID) 20 MG tablet Take 1 tablet (20 mg total) by mouth daily. 12/28/15 12/27/16  Triplett, Johnette Abraham B, FNP  gabapentin (NEURONTIN) 600 MG tablet Take 600 mg by mouth at bedtime.     [provider]  hydrOXYzine (ATARAX/VISTARIL) 25 MG tablet Take 1 tablet (25 mg total) by mouth every 6 (six) hours as needed. 12/28/15   Triplett, Johnette Abraham B, FNP  metoprolol succinate (TOPROL-XL) 50 MG 24 hr tablet Take 50 mg by mouth daily. Take with or immediately following a meal.    [provider]  montelukast (SINGULAIR) 10 MG tablet Take 10 mg by mouth at bedtime.  09/19/12   [provider]  MOVANTIK 25 MG TABS tablet Take 1 tablet daily by mouth. 10/16/17   [provider]  omeprazole (PRILOSEC) 20 MG capsule Take 20 mg by mouth daily.    [provider]  oxyCODONE-acetaminophen (PERCOCET) 10-325 MG tablet Take 1 tablet by mouth every 4 (four) hours as needed for pain.    [provider]  potassium chloride (K-DUR) 10 MEQ  tablet Take 10 mEq by mouth daily.    [provider]  potassium chloride (K-DUR,KLOR-CON) 10 MEQ tablet  02/07/16   [provider]  predniSONE (STERAPRED UNI-PAK 21 TAB) 10 MG (21) TBPK tablet Take 6 tablets on day 1 Take 5 tablets on day 2 Take 4 tablets on day 3 Take 3 tablets on day 4 Take 2 tablets on day 5 Take 1 tablet on day 6 12/28/15   Triplett, Cari B, FNP  simvastatin (ZOCOR) 20 MG tablet Take 1 tablet daily by mouth. 10/26/17   [provider]  tiotropium (SPIRIVA) 18 MCG inhalation capsule Place 18 mcg into inhaler and inhale daily.    [provider]  Vitamin D, Ergocalciferol, (DRISDOL) 50000 UNITS CAPS capsule Take 50,000 Units by mouth every 7 (seven) days.    [provider]      PHYSICAL EXAMINATION:   VITAL SIGNS: Blood pressure Marland Kitchen)  77/31, pulse 90, temperature 98.6 F (37 C), temperature source Oral, resp. rate 18, height 5\' 2"  (1.575 m), weight 49.9 kg (110 lb), SpO2 91 %.  GENERAL:  71 y.o.-year-old patient lying in the bed with no acute distress.  Frail appearing. EYES: Pupils equal, round, reactive to light and accommodation. No scleral icterus. Extraocular muscles intact.  HEENT: Head atraumatic, normocephalic. Oropharynx and nasopharynx clear.  NECK:  Supple, no jugular venous distention. No thyroid enlargement, no tenderness.  LUNGS: Diminished sounds at the right base.  No use of accessory muscles of respiration.  CARDIOVASCULAR: S1, S2 normal. No murmurs, rubs, or gallops.  ABDOMEN: Soft, nontender, nondistended. Bowel sounds present. No organomegaly or mass.  EXTREMITIES: No pedal edema, cyanosis, or clubbing.  NEUROLOGIC: Cranial nerves II through XII are intact. MAES. Gait not checked.  PSYCHIATRIC: The patient is alert and oriented x 3.  SKIN: No obvious rash, lesion, or ulcer.   LABORATORY PANEL:   CBC Recent Labs  Lab 11/11/17 1029  WBC 16.0*  HGB 10.4*  HCT 30.5*  PLT 323  MCV 86.3  MCH 29.4   MCHC 34.1  RDW 14.5   ------------------------------------------------------------------------------------------------------------------  Chemistries  Recent Labs  Lab 11/11/17 1029  NA 125*  K 3.1*  CL 91*  CO2 22  GLUCOSE 126*  BUN 60*  CREATININE 1.74*  CALCIUM 8.5*   ------------------------------------------------------------------------------------------------------------------ estimated creatinine clearance is 24 mL/min (A) (by C-G formula based on SCr of 1.74 mg/dL (H)). ------------------------------------------------------------------------------------------------------------------ No results for input(s): TSH, T4TOTAL, T3FREE, THYROIDAB in the last 72 hours.  Invalid input(s): FREET3   Coagulation profile No results for input(s): INR, PROTIME in the last 168 hours. ------------------------------------------------------------------------------------------------------------------- No results for input(s): DDIMER in the last 72 hours. -------------------------------------------------------------------------------------------------------------------  Cardiac Enzymes No results for input(s): CKMB, TROPONINI, MYOGLOBIN in the last 168 hours.  Invalid input(s): CK ------------------------------------------------------------------------------------------------------------------ Invalid input(s): POCBNP  ---------------------------------------------------------------------------------------------------------------  Urinalysis    Component Value Date/Time   COLORURINE YELLOW (A) 11/11/2017 1154   APPEARANCEUR HAZY (A) 11/11/2017 1154   LABSPEC 1.011 11/11/2017 1154   PHURINE 5.0 11/11/2017 1154   GLUCOSEU NEGATIVE 11/11/2017 1154   GLUCOSEU NEG mg/dL 08/25/2009 2042   HGBUR SMALL (A) 11/11/2017 1154   BILIRUBINUR NEGATIVE 11/11/2017 1154   KETONESUR NEGATIVE 11/11/2017 1154   PROTEINUR 30 (A) 11/11/2017 1154   UROBILINOGEN >8.0 (H) 03/13/2014 1222   NITRITE  NEGATIVE 11/11/2017 1154   LEUKOCYTESUR NEGATIVE 11/11/2017 1154     RADIOLOGY: Dg Chest 2 View  Result Date: 11/11/2017 CLINICAL DATA:  Cough.  Dyspnea. EXAM: CHEST  2 VIEW COMPARISON:  08/04/2017 and 05/09/2015 FINDINGS: There is an extensive area of consolidation in posterior aspect of the right lower lobe. There is suggestion of a small adjacent pleural effusion. The left lung is clear.  Heart size and vascularity are normal. Small calcified granulomas in the right lung apex. Aortic atherosclerosis. IMPRESSION: Right lower lobe pneumonia.  Small right effusion. Electronically Signed   By: Lorriane Shire M.D.   On: 11/11/2017 12:05    EKG: Orders placed or performed during the hospital encounter of 11/11/17  . ED EKG  . ED EKG  . EKG 12-Lead  . EKG 12-Lead    IMPRESSION AND PLAN: 1 acute sepsis secondary to right community-acquired pneumonia Admit to regular nursing floor bed on her sepsis/pneumonia protocol, empiric Rocephin/azithromycin 7-day course, IV fluids for rehydration, follow-up on cultures  2 acute right-sided community-acquired pneumonia Plan of care as stated above  3 acute kidney injury  Secondary to the  above and acute dehydration. Avoid nephrotoxic agents, IV fluids for rehydration, BMP daily  4 acute dehydration Replete with IV fluids  5 acute hyponatremia, hypochloremia, hypokalemia Replete with IV fluids, p.o. potassium, check BMP in the morning  Full code Condition stable Prognosis fair DVT prophylaxis with SCDs/TED hose given hemoptysis Disposition Home in 2-3 days   All the records are reviewed and case discussed with ED provider. Management plans discussed with the patient, family and they are in agreement.  CODE STATUS: Code Status History    This patient does not have a recorded code status. Please follow your organizational policy for patients in this situation.       TOTAL TIME TAKING CARE OF THIS PATIENT: 45 minutes.    Kelsey Long  Kelsey Long M.D on 11/11/2017   Between 7am to 6pm - Pager - [phone]  After 6pm go to [email or web address] - password [email or web address] [facility]  Office  [phone]  CC: Primary care physician; Alvester Chou, NP   Note: This dictation was prepared with Dragon dictation along with smaller phrase technology. Any transcriptional errors that result from this process are unintentional.

## 2017-11-11 NOTE — Progress Notes (Signed)
Chaplain received an order to visit with pt in room 116. Chaplain provided information for an advanced Directive.    11/11/17 1955  Clinical Encounter Type  Visited With Patient  Visit Type Initial  Referral From Nurse  Consult/Referral To Chaplain  Spiritual Encounters  Spiritual Needs Other (Comment)

## 2017-11-11 NOTE — ED Notes (Signed)
Patient transported to X-ray 

## 2017-11-11 NOTE — ED Provider Notes (Signed)
[facility] Emergency Department Provider Note       Time seen: ----------------------------------------- 11:17 AM on 11/11/2017 -----------------------------------------   I have reviewed the triage vital signs and the nursing notes.  HISTORY   Chief Complaint Dizziness    HPI Kelsey Long is a 71 y.o. female with a history of anxiety, chronic pain, COPD who presents to the ED for dizziness for the last week.  Patient states she is feeling shaky inside.  She denies any pain.  Son states she has had some hemoptysis and cough.  She does have a history of COPD.  She denies a recent change in her medications.  She arrives with a blood pressure in the 70s.  Past Medical History:  Diagnosis Date  . Anxiety   . Chronic pain   . COPD (chronic obstructive pulmonary disease) ([place])   . Hypertension   . Uterine cancer [facility])     Patient Active Problem List   Diagnosis Date Noted  . DOE (dyspnea on exertion) 09/24/2012    Past Surgical History:  Procedure Laterality Date  . GALLBLADDER SURGERY    . NECK SURGERY    . TOTAL ABDOMINAL HYSTERECTOMY      Allergies Penicillins; Sulfa drugs cross reactors; Tetracyclines & related; and Tramadol  Social History Social History   Tobacco Use  . Smoking status: Former Smoker    Packs/day: 1.00    Years: 40.00    Pack years: 40.00    Last attempt to quit: 12/24/2009    Years since quitting: 7.8  . Smokeless tobacco: Never Used  Substance Use Topics  . Alcohol use: Yes    Comment: rare  . Drug use: No    Review of Systems Constitutional: Negative for fever. Eyes: Negative for vision changes ENT:  Negative for congestion, sore throat.  Positive for increased thirst Cardiovascular: Negative for chest pain. Respiratory: Positive for cough and hemoptysis Gastrointestinal: Negative for abdominal pain, vomiting and diarrhea. Genitourinary: Negative for dysuria. Musculoskeletal: Negative for back  pain. Skin: Negative for rash. Neurological: Positive for weakness  All systems negative/normal/unremarkable except as stated in the HPI  ____________________________________________   PHYSICAL EXAM:  VITAL SIGNS: ED Triage Vitals  Enc Vitals Group     BP 11/11/17 1025 (!) 77/31     Pulse Rate 11/11/17 1025 90     Resp 11/11/17 1025 18     Temp 11/11/17 1025 98.6 F (37 C)     Temp Source 11/11/17 1025 Oral     SpO2 11/11/17 1025 91 %     Weight 11/11/17 1023 110 lb (49.9 kg)     Height 11/11/17 1023 5\' 2"  (1.575 m)     Head Circumference --      Peak Flow --      Pain Score --      Pain Loc --      Pain Edu? --      Excl. in [place]? --     Constitutional: Alert and oriented.  Mild distress Eyes: Conjunctivae are normal. Normal extraocular movements. ENT   Head: Normocephalic and atraumatic.   Nose: No congestion/rhinnorhea.   Mouth/Throat: Mucous membranes are very dry   Neck: No stridor. Cardiovascular: Normal rate, regular rhythm. No murmurs, rubs, or gallops. Respiratory: Normal respiratory effort without tachypnea nor retractions. Breath sounds are clear and equal bilaterally. No wheezes/rales/rhonchi. Gastrointestinal: Soft and nontender. Normal bowel sounds Musculoskeletal: Nontender with normal range of motion in extremities. No lower extremity tenderness nor edema. Neurologic:  Normal speech and language. No gross focal neurologic deficits are appreciated.  Skin:  Skin is warm, dry and intact. No rash noted. Psychiatric: Mood and affect are normal. Speech and behavior are normal.  ____________________________________________  EKG: Interpreted by me.  Sinus rhythm the rate of 88 bpm, normal PR interval, normal QRS, normal QT.  Septal infarct age indeterminant  ____________________________________________  ED COURSE:  Pertinent labs & imaging results that were available during my care of the patient were reviewed by me and considered in my medical  decision making (see chart for details). Patient presents for dizziness and hypotension, we will assess with labs and imaging as indicated.  She will receive 2 L of IV fluid.   Procedures ____________________________________________   LABS (pertinent positives/negatives)  Labs Reviewed  BASIC METABOLIC PANEL - Abnormal; Notable for the following components:      Result Value   Sodium 125 (*)    Potassium 3.1 (*)    Chloride 91 (*)    Glucose, Bld 126 (*)    BUN 60 (*)    Creatinine, Ser 1.74 (*)    Calcium 8.5 (*)    GFR calc non Af Amer 29 (*)    GFR calc Af Amer 33 (*)    All other components within normal limits  CBC - Abnormal; Notable for the following components:   WBC 16.0 (*)    RBC 3.53 (*)    Hemoglobin 10.4 (*)    HCT 30.5 (*)    All other components within normal limits  LACTIC ACID, PLASMA - Abnormal; Notable for the following components:   Lactic Acid, Venous 2.2 (*)    All other components within normal limits  CULTURE, BLOOD (ROUTINE X 2)  CULTURE, BLOOD (ROUTINE X 2)  URINALYSIS, COMPLETE (UACMP) WITH MICROSCOPIC  LACTIC ACID, PLASMA  CBG MONITORING, ED   CRITICAL CARE Performed by: Earleen Newport   Total critical care time: 30 minutes  Critical care time was exclusive of separately billable procedures and treating other patients.  Critical care was necessary to treat or prevent imminent or life-threatening deterioration.  Critical care was time spent personally by me on the following activities: development of treatment plan with patient and/or surrogate as well as nursing, discussions with consultants, evaluation of patient's response to treatment, examination of patient, obtaining history from patient or surrogate, ordering and performing treatments and interventions, ordering and review of laboratory studies, ordering and review of radiographic studies, pulse oximetry and re-evaluation of patient's condition.  RADIOLOGY Images were viewed by  me  Chest x-ray IMPRESSION: Right lower lobe pneumonia. Small right effusion. ____________________________________________  DIFFERENTIAL DIAGNOSIS   Dehydration, anemia, diabetes, electrolyte abnormality, sepsis  FINAL ASSESSMENT AND PLAN  Dehydration, pneumonia, possible sepsis   Plan: Patient had presented for dizziness, weakness and cough. Patient's labs were concerning for sepsis in conjunction with her hypotension. Patient's imaging did reveal a right lower lobe pneumonia.  She was given vancomycin and cefepime to cover for sepsis.  She has received a fluid challenge and her blood pressure is currently 94/46.  She is awake and alert with no complaints at this time.  She does have occasional cough likely from the pneumonia.   Earleen Newport, MD   Note: This note was generated in part or whole with voice recognition software. Voice recognition is usually quite accurate but there are transcription errors that can and very often do occur. I apologize for any typographical errors that were not detected and corrected.  Earleen Newport, MD 11/11/17 [phone]

## 2017-11-12 ENCOUNTER — Other Ambulatory Visit: Payer: Self-pay

## 2017-11-12 LAB — CBC WITH DIFFERENTIAL/PLATELET
Basophils Absolute: 0 10*3/uL (ref 0–0.1)
Basophils Relative: 0 %
Eosinophils Absolute: 0.1 10*3/uL (ref 0–0.7)
Eosinophils Relative: 1 %
HCT: 31.2 % — ABNORMAL LOW (ref 35.0–47.0)
Hemoglobin: 10.5 g/dL — ABNORMAL LOW (ref 12.0–16.0)
Lymphocytes Relative: 7 %
Lymphs Abs: 1.1 10*3/uL (ref 1.0–3.6)
MCH: 28.8 pg (ref 26.0–34.0)
MCHC: 33.5 g/dL (ref 32.0–36.0)
MCV: 86 fL (ref 80.0–100.0)
Monocytes Absolute: 0.5 10*3/uL (ref 0.2–0.9)
Monocytes Relative: 4 %
Neutro Abs: 13.2 10*3/uL — ABNORMAL HIGH (ref 1.4–6.5)
Neutrophils Relative %: 88 %
Platelets: 346 10*3/uL (ref 150–440)
RBC: 3.63 MIL/uL — ABNORMAL LOW (ref 3.80–5.20)
RDW: 14.5 % (ref 11.5–14.5)
WBC: 14.9 10*3/uL — ABNORMAL HIGH (ref 3.6–11.0)

## 2017-11-12 LAB — BLOOD CULTURE ID PANEL (REFLEXED)

## 2017-11-12 LAB — BASIC METABOLIC PANEL
Anion gap: 7 (ref 5–15)
BUN: 35 mg/dL — AB (ref 6–20)
CHLORIDE: 104 mmol/L (ref 101–111)
CO2: 22 mmol/L (ref 22–32)
CREATININE: 1.09 mg/dL — AB (ref 0.44–1.00)
Calcium: 8.4 mg/dL — ABNORMAL LOW (ref 8.9–10.3)
GFR calc Af Amer: 59 mL/min — ABNORMAL LOW (ref >60)
GFR calc non Af Amer: 51 mL/min — ABNORMAL LOW (ref >60)
GLUCOSE: 78 mg/dL (ref 65–99)
POTASSIUM: 3.9 mmol/L (ref 3.5–5.1)
Sodium: 133 mmol/L — ABNORMAL LOW (ref 135–145)

## 2017-11-12 LAB — HIV ANTIBODY (ROUTINE TESTING W REFLEX): HIV SCREEN 4TH GENERATION: NONREACTIVE

## 2017-11-12 LAB — STREP PNEUMONIAE URINARY ANTIGEN: STREP PNEUMO URINARY ANTIGEN: POSITIVE — AB

## 2017-11-12 LAB — LEGIONELLA PNEUMOPHILA SEROGP 1 UR AG: L. pneumophila Serogp 1 Ur Ag: NEGATIVE

## 2017-11-12 MED ORDER — DEXTROSE 5 % IV SOLN
2.0000 g | INTRAVENOUS | Status: DC
  Administered 2017-11-12 – 2017-11-15 (×4): 2 g via INTRAVENOUS
  Filled 2017-11-12 (×4): qty 2

## 2017-11-12 MED ORDER — ACETAMINOPHEN 325 MG PO TABS
650.0000 mg | ORAL_TABLET | Freq: Four times a day (QID) | ORAL | Status: DC | PRN
  Administered 2017-11-14: 04:00:00 650 mg via ORAL
  Filled 2017-11-12: qty 2

## 2017-11-12 MED ORDER — ENOXAPARIN SODIUM 40 MG/0.4ML ~~LOC~~ SOLN
40.0000 mg | SUBCUTANEOUS | Status: DC
  Administered 2017-11-12 – 2017-11-14 (×3): 40 mg via SUBCUTANEOUS
  Filled 2017-11-12 (×3): qty 0.4

## 2017-11-12 MED ORDER — BENZONATATE 100 MG PO CAPS
100.0000 mg | ORAL_CAPSULE | Freq: Three times a day (TID) | ORAL | Status: DC
  Administered 2017-11-12 – 2017-11-15 (×9): 100 mg via ORAL
  Filled 2017-11-12 (×10): qty 1

## 2017-11-12 MED ORDER — HYDROCOD POLST-CPM POLST ER 10-8 MG/5ML PO SUER
5.0000 mL | Freq: Two times a day (BID) | ORAL | Status: DC
  Administered 2017-11-12 – 2017-11-15 (×7): 5 mL via ORAL
  Filled 2017-11-12 (×7): qty 5

## 2017-11-12 MED ORDER — SODIUM CHLORIDE 0.9 % IV SOLN
INTRAVENOUS | Status: DC
  Administered 2017-11-12 – 2017-11-15 (×5): via INTRAVENOUS

## 2017-11-12 NOTE — Evaluation (Signed)
Physical Therapy Evaluation Patient Details Name: Kelsey Long MRN: [id number] DOB: 10/18/1946 Today's Date: 11/12/2017   History of Present Illness  Pt is a 71 y.o. female presenting to hospital with dizziness x1 week, shakiness, weakness, and cough.  Pt hypotensive in ED.  Pt admitted with acute sepsis secondary to R community acquired PNA; also AKI.  PMH includes COPD, anxiety, chronic pain, htn, DOE, uterine CA, neck surgery.  No home O2 per chart.  Clinical Impression  Prior to hospital admission, pt reports being ambulatory.  Pt reports living with family in 1 level home with stairs to enter.  Currently pt is min assist with bed mobility, CGA with transfers, and CGA to min assist ambulating with quad cane (pt intermittently unsteady and may benefit from trial of RW to determine if this would improve pt's balance with ambulation).  Pt's O2 sats 93% on 2 L O2 via nasal cannula at rest; decreased to 87% post ambulation on 2 L O2; and increased up to 95% on 2 L O2 via nasal cannula end of session (nursing notified).  Pt would benefit from skilled PT to address noted impairments and functional limitations (see below for any additional details).  Upon hospital discharge, recommend pt discharge to home with HHPT and support of family.    Follow Up Recommendations Home health PT;Supervision for mobility/OOB    Equipment Recommendations  Rolling walker with 5" wheels    Recommendations for Other Services       Precautions / Restrictions Precautions Precautions: Fall Restrictions Weight Bearing Restrictions: No      Mobility  Bed Mobility Overal bed mobility: Needs Assistance Bed Mobility: Supine to Sit     Supine to sit: Min assist;HOB elevated     General bed mobility comments: minimal assist for trunk supine to sit; increased effort and time to perform  Transfers Overall transfer level: Needs assistance Equipment used: Quad cane Transfers: Sit to/from Stand Sit to Stand:  Min guard         General transfer comment: pt holding onto bed and quad cane to stand (CGA for safety)  Ambulation/Gait Ambulation/Gait assistance: Min guard;Min assist Ambulation Distance (Feet): 50 Feet Assistive device: Quad cane   Gait velocity: decreased   General Gait Details: decreased B step length; intermittently unsteady (pt holding onto railing in hallway occasionally for balance)  Stairs            Wheelchair Mobility    Modified Rankin (Stroke Patients Only)       Balance Overall balance assessment: Needs assistance Sitting-balance support: No upper extremity supported;Feet supported Sitting balance-Leahy Scale: Good Sitting balance - Comments: sitting reaching within BOS   Standing balance support: Single extremity supported Standing balance-Leahy Scale: Fair Standing balance comment: requires single UE support for static balance                             Pertinent Vitals/Pain Pain Assessment: No/denies pain      Home Living Family/patient expects to be discharged to:: Private residence Living Arrangements: Children(Pt's son, son's wife, and son's daughter)   Type of Home: House Home Access: Stairs to enter Entrance Stairs-Rails: Right Entrance Stairs-Number of Steps: 5 Home Layout: One level Home Equipment: Cane - quad      Prior Function Level of Independence: Independent with assistive device(s)         Comments: Pt reports being ambulatory without AD (although quad cane present in pt's room).  Pt reports h/o multiple falls.  No home O2.     Hand Dominance        Extremity/Trunk Assessment   Upper Extremity Assessment Upper Extremity Assessment: Generalized weakness    Lower Extremity Assessment Lower Extremity Assessment: Generalized weakness    Cervical / Trunk Assessment Cervical / Trunk Assessment: Normal  Communication   Communication: No difficulties  Cognition Arousal/Alertness:  Awake/alert Behavior During Therapy: WFL for tasks assessed/performed Overall Cognitive Status: (Oriented to person, place, situation, and month)                                        General Comments General comments (skin integrity, edema, etc.): Pt resting in bed upon PT entry.  Nursing cleared pt for participation in physical therapy.  Pt agreeable to PT session.    Exercises  Mobility training; vc's for pursed lip breathing with mobility.   Assessment/Plan    PT Assessment Patient needs continued PT services  PT Problem List Decreased strength;Decreased balance;Decreased activity tolerance;Decreased mobility;Cardiopulmonary status limiting activity       PT Treatment Interventions DME instruction;Gait training;Stair training;Functional mobility training;Therapeutic activities;Therapeutic exercise;Balance training;Patient/family education    PT Goals (Current goals can be found in the Care Plan section)  Acute Rehab PT Goals Patient Stated Goal: to feel better PT Goal Formulation: With patient Time For Goal Achievement: 11/26/17 Potential to Achieve Goals: Fair    Frequency Min 2X/week   Barriers to discharge        Co-evaluation               AM-PAC PT "6 Clicks" Daily Activity  Outcome Measure Difficulty turning over in bed (including adjusting bedclothes, sheets and blankets)?: A Little Difficulty moving from lying on back to sitting on the side of the bed? : Unable Difficulty sitting down on and standing up from a chair with arms (e.g., wheelchair, bedside commode, etc,.)?: Unable Help needed moving to and from a bed to chair (including a wheelchair)?: A Little Help needed walking in hospital room?: A Little Help needed climbing 3-5 steps with a railing? : A Little 6 Click Score: 14    End of Session Equipment Utilized During Treatment: Gait belt;Oxygen(2 L O2 via nasal cannula) Activity Tolerance: Patient tolerated treatment  well Patient left: in chair;with call bell/phone within reach;with chair alarm set;with nursing/sitter in room Nurse Communication: Mobility status;Precautions;Other (comment)(Pt's O2 sats during session) PT Visit Diagnosis: Unsteadiness on feet (R26.81);Muscle weakness (generalized) (M62.81);History of falling (Z91.81)    Time: 1610-9604 PT Time Calculation (min) (ACUTE ONLY): 23 min   Charges:   PT Evaluation $PT Eval Low Complexity: 1 Low PT Treatments $Therapeutic Activity: 8-22 mins   PT G Codes:   PT G-Codes **NOT FOR INPATIENT CLASS** Functional Assessment Tool Used: AM-PAC 6 Clicks Basic Mobility Functional Limitation: Mobility: Walking and moving around Mobility: Walking and Moving Around Current Status (V4098): At least 40 percent but less than 60 percent impaired, limited or restricted Mobility: Walking and Moving Around Goal Status [phone]): At least 1 percent but less than 20 percent impaired, limited or restricted    Leitha Bleak, PT 11/12/17, 4:56 PM [phone]

## 2017-11-12 NOTE — Progress Notes (Signed)
PHARMACY - PHYSICIAN COMMUNICATION CRITICAL VALUE ALERT - BLOOD CULTURE IDENTIFICATION (BCID)  Results for orders placed or performed during the hospital encounter of 11/11/17  Blood Culture ID Panel (Reflexed) (Collected: 11/11/2017 10:29 AM)  Result Value Ref Range   Enterococcus species NOT DETECTED NOT DETECTED   Listeria monocytogenes NOT DETECTED NOT DETECTED   Staphylococcus species NOT DETECTED NOT DETECTED   Staphylococcus aureus NOT DETECTED NOT DETECTED   Streptococcus species DETECTED (A) NOT DETECTED   Streptococcus agalactiae NOT DETECTED NOT DETECTED   Streptococcus pneumoniae DETECTED (A) NOT DETECTED   Streptococcus pyogenes NOT DETECTED NOT DETECTED   Acinetobacter baumannii NOT DETECTED NOT DETECTED   Enterobacteriaceae species NOT DETECTED NOT DETECTED   Enterobacter cloacae complex NOT DETECTED NOT DETECTED   Escherichia coli NOT DETECTED NOT DETECTED   Klebsiella oxytoca NOT DETECTED NOT DETECTED   Klebsiella pneumoniae NOT DETECTED NOT DETECTED   Proteus species NOT DETECTED NOT DETECTED   Serratia marcescens NOT DETECTED NOT DETECTED   Haemophilus influenzae NOT DETECTED NOT DETECTED   Neisseria meningitidis NOT DETECTED NOT DETECTED   Pseudomonas aeruginosa NOT DETECTED NOT DETECTED   Candida albicans NOT DETECTED NOT DETECTED   Candida glabrata NOT DETECTED NOT DETECTED   Candida krusei NOT DETECTED NOT DETECTED   Candida parapsilosis NOT DETECTED NOT DETECTED   Candida tropicalis NOT DETECTED NOT DETECTED  BCID report found on chart review during daily review 2/2 BCx with [place]  Name of physician (or Provider) Contacted: Dr Tressia Miners  Changes to prescribed antibiotics required: Ceftriaxone dose increased from 1g to 2 g daily  Rocky Morel 11/12/2017  9:05 AM

## 2017-11-12 NOTE — Plan of Care (Signed)
Free of falls during shift.  Denies pain.  No needs overnight.  Bed in low position, call bell within reach.  WCTM.

## 2017-11-12 NOTE — Progress Notes (Signed)
[facility] NAME: Raetta Agostinelli    MR#:  [id number]  DATE OF BIRTH:  1946/11/19  SUBJECTIVE:  CHIEF COMPLAINT:   Chief Complaint  Patient presents with  . Dizziness   -Appears confused.  Admitted for sepsis secondary to right lower lobe pneumonia. -Hypoxic and needing 3 L oxygen acutely -Positive blood cultures  REVIEW OF SYSTEMS:  Review of Systems  Constitutional: Positive for chills and malaise/fatigue. Negative for fever.  HENT: Negative for congestion, ear discharge, hearing loss and nosebleeds.   Eyes: Negative for blurred vision and double vision.  Respiratory: Positive for cough and shortness of breath. Negative for wheezing.   Cardiovascular: Negative for chest pain and palpitations.  Gastrointestinal: Negative for abdominal pain, constipation, diarrhea, nausea and vomiting.  Genitourinary: Negative for dysuria.  Musculoskeletal: Negative for myalgias.  Neurological: Negative for dizziness, speech change, focal weakness, seizures and headaches.  Psychiatric/Behavioral: Negative for depression.    DRUG ALLERGIES:   Allergies  Allergen Reactions  . Penicillins Swelling    Has patient had a PCN reaction causing immediate rash, facial/tongue/throat swelling, SOB or lightheadedness with hypotension: No Has patient had a PCN reaction causing severe rash involving mucus membranes or skin necrosis: No Has patient had a PCN reaction that required hospitalization: No Has patient had a PCN reaction occurring within the last 10 years: No If all of the above answers are "NO", then may proceed with Cephalosporin use.   . Sulfa Drugs Cross Reactors Swelling  . Tetracyclines & Related Swelling  . Tramadol Rash    VITALS:  Blood pressure 138/61, pulse 99, temperature 98.1 F (36.7 C), temperature source Oral, resp. rate 18, height 5\' 2"  (1.575 m), weight 49.9 kg (110 lb), SpO2 94 %.  PHYSICAL EXAMINATION:  Physical  Exam  GENERAL:  71 y.o.-year-old patient lying in the bed with no acute distress.  EYES: Pupils equal, round, reactive to light and accommodation. No scleral icterus. Extraocular muscles intact.  HEENT: Head atraumatic, normocephalic. Oropharynx and nasopharynx clear.  NECK:  Supple, no jugular venous distention. No thyroid enlargement, no tenderness.  LUNGS: Normal breath sounds bilaterally, no wheezing, bibasilar rhonchi heard, right greater than left.  No rales or crepitation. No use of accessory muscles of respiration.  CARDIOVASCULAR: S1, S2 normal. No murmurs, rubs, or gallops.  ABDOMEN: Soft, nontender, nondistended. Bowel sounds present. No organomegaly or mass.  EXTREMITIES: No pedal edema, cyanosis, or clubbing.  NEUROLOGIC: Cranial nerves II through XII are intact. Muscle strength 5/5 in all extremities. Sensation intact. Gait not checked.  Global weakness present PSYCHIATRIC: The patient is alert and oriented x 2-3, intermittent confusion noted.  SKIN: No obvious rash, lesion, or ulcer.    LABORATORY PANEL:   CBC Recent Labs  Lab 11/12/17 0323  WBC 14.9*  HGB 10.5*  HCT 31.2*  PLT 346   ------------------------------------------------------------------------------------------------------------------  Chemistries  Recent Labs  Lab 11/12/17 0323  NA 133*  K 3.9  CL 104  CO2 22  GLUCOSE 78  BUN 35*  CREATININE 1.09*  CALCIUM 8.4*   ------------------------------------------------------------------------------------------------------------------  Cardiac Enzymes Recent Labs  Lab 11/11/17 1312  TROPONINI <0.03   ------------------------------------------------------------------------------------------------------------------  RADIOLOGY:  Dg Chest 2 View  Result Date: 11/11/2017 CLINICAL DATA:  Cough.  Dyspnea. EXAM: CHEST  2 VIEW COMPARISON:  08/04/2017 and 05/09/2015 FINDINGS: There is an extensive area of consolidation in posterior aspect of the right  lower lobe. There is suggestion of a small adjacent pleural effusion. The left  lung is clear.  Heart size and vascularity are normal. Small calcified granulomas in the right lung apex. Aortic atherosclerosis. IMPRESSION: Right lower lobe pneumonia.  Small right effusion. Electronically Signed   By: Lorriane Shire M.D.   On: 11/11/2017 12:05    EKG:   Orders placed or performed during the hospital encounter of 11/11/17  . ED EKG  . ED EKG  . EKG 12-Lead  . EKG 12-Lead    ASSESSMENT AND PLAN:   71 year old female with past medical history significant for COPD not on home oxygen, chronic pain, hypertension and anxiety resents to hospital secondary to worsening shortness of breath and also fever.  1.  Sepsis-secondary to community-acquired pneumonia. -Chest x-ray with right lower lobe infiltrate.  Positive blood cultures with Streptococcus pneumonia -WBC improving now -Increase the dose of Rocephin, continue azithromycin -Continue oxygen support and wean as tolerated. -We will add cough medications  2.  Acute hypoxic respiratory failure-secondary to community-acquired pneumonia -On 2-3 L oxygen at this time.  Wean slowly -Cough medications, incentive spirometry recommended.  3.  Acute renal failure-received IV fluids and renal function has improved significantly. Definitely nephrotoxic agents and monitor  4.  Hyponatremia-improving with fluids.  5.  Acute metabolic encephalopathy-secondary to sepsis.  We will decrease her pain medication doses if needed and monitor closely  6.  DVT prophylaxis-start on Lovenox.  Physical therapy and stable.   All the records are reviewed and case discussed with Care Management/Social Workerr. Management plans discussed with the patient, family and they are in agreement.  CODE STATUS: Full code  TOTAL TIME TAKING CARE OF THIS PATIENT: 38 minutes.   POSSIBLE D/C IN 2 DAYS, DEPENDING ON CLINICAL CONDITION.   Hikaru Delorenzo M.D on  11/12/2017 at 1:02 PM  Between 7am to 6pm - Pager - [phone]  After 6pm go to [email or web address] - password [email or web address] [facility]  Office  [phone]  CC: Primary care physician; Alvester Chou, NP

## 2017-11-13 LAB — BASIC METABOLIC PANEL
Anion gap: 13 (ref 5–15)
BUN: 12 mg/dL (ref 6–20)
CO2: 23 mmol/L (ref 22–32)
CREATININE: 0.68 mg/dL (ref 0.44–1.00)
Calcium: 8.5 mg/dL — ABNORMAL LOW (ref 8.9–10.3)
Chloride: 93 mmol/L — ABNORMAL LOW (ref 101–111)
GFR calc Af Amer: >60 mL/min (ref >60)
GLUCOSE: 67 mg/dL (ref 65–99)
Potassium: 3.5 mmol/L (ref 3.5–5.1)
SODIUM: 129 mmol/L — AB (ref 135–145)

## 2017-11-13 MED ORDER — CLONIDINE HCL 0.1 MG PO TABS
0.1000 mg | ORAL_TABLET | Freq: Two times a day (BID) | ORAL | Status: DC
  Administered 2017-11-13 – 2017-11-15 (×5): 0.1 mg via ORAL
  Filled 2017-11-13 (×5): qty 1

## 2017-11-13 MED ORDER — METOPROLOL TARTRATE 25 MG PO TABS
25.0000 mg | ORAL_TABLET | Freq: Two times a day (BID) | ORAL | Status: DC
  Administered 2017-11-13 – 2017-11-15 (×5): 25 mg via ORAL
  Filled 2017-11-13 (×5): qty 1

## 2017-11-13 MED ORDER — OXYCODONE-ACETAMINOPHEN 5-325 MG PO TABS: 1.0000 | ORAL_TABLET | Freq: Four times a day (QID) | ORAL | Status: DC | PRN

## 2017-11-13 NOTE — Plan of Care (Signed)
  Progressing Education: Knowledge of General Education information will improve 11/13/2017 1131 - Progressing by Darrelyn Hillock, RN Health Behavior/Discharge Planning: Ability to manage health-related needs will improve 11/13/2017 1131 - Progressing by Darrelyn Hillock, RN Activity: Risk for activity intolerance will decrease 11/13/2017 1131 - Progressing by Darrelyn Hillock, RN Nutrition: Adequate nutrition will be maintained 11/13/2017 1131 - Progressing by Darrelyn Hillock, RN Coping: Level of anxiety will decrease 11/13/2017 1131 - Progressing by Darrelyn Hillock, RN Respiratory: Ability to maintain adequate ventilation will improve 11/13/2017 1131 - Progressing by Darrelyn Hillock, RN Ability to maintain a clear airway will improve 11/13/2017 1131 - Progressing by Darrelyn Hillock, RN

## 2017-11-13 NOTE — Progress Notes (Signed)
Physical Therapy Treatment Patient Details Name: Kelsey Long MRN: [id number] DOB: [birth year] Today's Date: 11/13/2017    History of Present Illness Pt is a 71 y.o. female presenting to hospital with dizziness x1 week, shakiness, weakness, and cough.  Pt hypotensive in ED.  Pt admitted with acute sepsis secondary to R community acquired PNA; also AKI.  PMH includes COPD, anxiety, chronic pain, htn, DOE, uterine CA, neck surgery.  No home O2 per chart.    PT Comments    Pt presents with deficits in strength, transfers, mobility, gait, balance, and activity tolerance.  Pt lethargic this session with significant verbal and tactile cues at times to initiate and sustain movement, nursing notified.  Pt required decreased physical assistance with bed mobility this session and was grossly more stable during amb with RW compared to QC.  Pt on 2LO2/min during session with SpO2 in the low 90s and HR in the upper 90s to low 100s during session.  Pt will benefit from HHPT services upon discharge to safely address above deficits for decreased caregiver assistance and eventual return to PLOF.       Follow Up Recommendations  Home health PT;Supervision for mobility/OOB     Equipment Recommendations  Rolling walker with 5" wheels    Recommendations for Other Services       Precautions / Restrictions Precautions Precautions: Fall Restrictions Weight Bearing Restrictions: No    Mobility  Bed Mobility Overal bed mobility: Needs Assistance Bed Mobility: Supine to Sit;Sit to Supine     Supine to sit: Supervision Sit to supine: Supervision   General bed mobility comments: Increased time and effort to perform bed mobility tasks but no physical assist  Transfers Overall transfer level: Needs assistance Equipment used: Rolling walker (2 wheeled) Transfers: Sit to/from Stand Sit to Stand: Min guard         General transfer comment: Good speed during sit to stand and good eccentric control  during stand to sit  Ambulation/Gait Ambulation/Gait assistance: Min guard Ambulation Distance (Feet): 30 Feet, 2 x 15' Assistive device: Rolling walker (2 wheeled) Gait Pattern/deviations: Step-through pattern;Decreased step length - right;Decreased step length - left;Trunk flexed   Gait velocity interpretation: Below normal speed for age/gender General Gait Details: Improved stability with RW compared to QC but pt still required CGA for safety with cues for general sequencing with RW   Stairs            Wheelchair Mobility    Modified Rankin (Stroke Patients Only)       Balance Overall balance assessment: Needs assistance Sitting-balance support: No upper extremity supported;Feet supported Sitting balance-Leahy Scale: Good     Standing balance support: Bilateral upper extremity supported Standing balance-Leahy Scale: Fair                              Cognition Arousal/Alertness: Lethargic Behavior During Therapy: WFL for tasks assessed/performed Overall Cognitive Status: Within Functional Limits for tasks assessed                                        Exercises Total Joint Exercises Ankle Circles/Pumps: AROM;Both;10 reps Quad Sets: Strengthening;Both;10 reps Short Arc Quad: AROM;Both;10 reps Heel Slides: AROM;Both;5 reps Hip ABduction/ADduction: AROM;Both;10 reps Straight Leg Raises: AROM;Both;10 reps Long Arc Quad: AROM;Both;10 reps;15 reps Knee Flexion: AROM;Both;10 reps;15 reps Marching in Standing: AROM;Both;5 reps Other  Exercises Other Exercises: Bridges x 5    General Comments        Pertinent Vitals/Pain Pain Assessment: No/denies pain    Home Living                      Prior Function            PT Goals (current goals can now be found in the care plan section) Progress towards PT goals: Progressing toward goals    Frequency    Min 2X/week      PT Plan Current plan remains appropriate     Co-evaluation              AM-PAC PT "6 Clicks" Daily Activity  Outcome Measure                   End of Session Equipment Utilized During Treatment: Gait belt;Oxygen Activity Tolerance: Patient tolerated treatment well Patient left: in bed;with call bell/phone within reach;with bed alarm set Nurse Communication: Mobility status;Other (comment)(Pt lethargic with increased cueing required during session) PT Visit Diagnosis: Unsteadiness on feet (R26.81);Muscle weakness (generalized) (M62.81);History of falling (Z91.81)     Time: 6389-3734 PT Time Calculation (min) (ACUTE ONLY): 24 min  Charges:  $Gait Training: 8-22 mins $Therapeutic Exercise: 8-22 mins                    G Codes:       DRoyetta Asal PT, DPT 11/13/17, 12:08 PM

## 2017-11-13 NOTE — Care Management Important Message (Signed)
Important Message  Patient Details  Name: Kelsey Long MRN: [id number] Date of Birth: 04-11-1946   Medicare Important Message Given:  Yes    Shelbie Ammons, RN 11/13/2017, 6:53 AM

## 2017-11-13 NOTE — Progress Notes (Signed)
[facility] [place] NAME: Kelsey Long    MR#:  [id number]  DATE OF BIRTH:  November 27, 1946  SUBJECTIVE:  CHIEF COMPLAINT:   Chief Complaint  Patient presents with  . Dizziness   - very sleepy, arousable - still needing 2L o2 today  REVIEW OF SYSTEMS:  Review of Systems  Constitutional: Positive for chills and malaise/fatigue. Negative for fever.  HENT: Negative for congestion, ear discharge, hearing loss and nosebleeds.   Eyes: Negative for blurred vision and double vision.  Respiratory: Positive for cough and shortness of breath. Negative for wheezing.   Cardiovascular: Negative for chest pain and palpitations.  Gastrointestinal: Negative for abdominal pain, constipation, diarrhea, nausea and vomiting.  Genitourinary: Negative for dysuria.  Musculoskeletal: Negative for myalgias.  Neurological: Negative for dizziness, speech change, focal weakness, seizures and headaches.  Psychiatric/Behavioral: Negative for depression.    DRUG ALLERGIES:   Allergies  Allergen Reactions  . Penicillins Swelling    Has patient had a PCN reaction causing immediate rash, facial/tongue/throat swelling, SOB or lightheadedness with hypotension: No Has patient had a PCN reaction causing severe rash involving mucus membranes or skin necrosis: No Has patient had a PCN reaction that required hospitalization: No Has patient had a PCN reaction occurring within the last 10 years: No If all of the above answers are "NO", then may proceed with Cephalosporin use.   . Sulfa Drugs Cross Reactors Swelling  . Tetracyclines & Related Swelling  . Tramadol Rash    VITALS:  Blood pressure (!) 167/85, pulse (!) 114, temperature 98 F (36.7 C), temperature source Oral, resp. rate 20, height 5\' 2"  (1.575 m), weight 49.9 kg (110 lb), SpO2 96 %.  PHYSICAL EXAMINATION:  Physical Exam  GENERAL:  71 y.o.-year-old patient lying in the bed with no acute distress.    EYES: Pupils equal, round, reactive to light and accommodation. No scleral icterus. Extraocular muscles intact.  HEENT: Head atraumatic, normocephalic. Oropharynx and nasopharynx clear.  NECK:  Supple, no jugular venous distention. No thyroid enlargement, no tenderness.  LUNGS: Normal breath sounds bilaterally, no wheezing, bibasilar rhonchi heard, right greater than left.  No rales or crepitation. No use of accessory muscles of respiration.  CARDIOVASCULAR: S1, S2 normal. No murmurs, rubs, or gallops.  ABDOMEN: Soft, nontender, nondistended. Bowel sounds present. No organomegaly or mass.  EXTREMITIES: No pedal edema, cyanosis, or clubbing.  NEUROLOGIC: Cranial nerves II through XII are intact. Muscle strength 5/5 in all extremities. Sensation intact. Gait not checked.  Global weakness present PSYCHIATRIC: The patient is very sleepy but arousable and oriented x 2-3, intermittent confusion noted.  SKIN: No obvious rash, lesion, or ulcer.    LABORATORY PANEL:   CBC Recent Labs  Lab 11/12/17 0323  WBC 14.9*  HGB 10.5*  HCT 31.2*  PLT 346   ------------------------------------------------------------------------------------------------------------------  Chemistries  Recent Labs  Lab 11/13/17 0906  NA 129*  K 3.5  CL 93*  CO2 23  GLUCOSE 67  BUN 12  CREATININE 0.68  CALCIUM 8.5*   ------------------------------------------------------------------------------------------------------------------  Cardiac Enzymes Recent Labs  Lab 11/11/17 1312  TROPONINI <0.03   ------------------------------------------------------------------------------------------------------------------  RADIOLOGY:  Dg Chest 2 View  Result Date: 11/11/2017 CLINICAL DATA:  Cough.  Dyspnea. EXAM: CHEST  2 VIEW COMPARISON:  08/04/2017 and 05/09/2015 FINDINGS: There is an extensive area of consolidation in posterior aspect of the right lower lobe. There is suggestion of a small adjacent pleural  effusion. The left lung is clear.  Heart size  and vascularity are normal. Small calcified granulomas in the right lung apex. Aortic atherosclerosis. IMPRESSION: Right lower lobe pneumonia.  Small right effusion. Electronically Signed   By: Lorriane Shire M.D.   On: 11/11/2017 12:05    EKG:   Orders placed or performed during the hospital encounter of 11/11/17  . ED EKG  . ED EKG  . EKG 12-Lead  . EKG 12-Lead    ASSESSMENT AND PLAN:   71 year old female with past medical history significant for COPD not on home oxygen, chronic pain, hypertension and anxiety resents to hospital secondary to worsening shortness of breath and also fever.  1.  Sepsis-secondary to community-acquired pneumonia. -Chest x-ray with right lower lobe infiltrate.  Positive blood cultures with Streptococcus pneumonia -WBC improving  -On Rocephin, continue azithromycin -Continue oxygen support and wean as tolerated. -added cough medications  2.  Acute hypoxic respiratory failure-secondary to community-acquired pneumonia -On 2-3 L oxygen at this time.  Wean slowly -Cough medications, incentive spirometry recommended.  3.  Acute renal failure-received IV fluids and renal function has improved significantly. Definitely nephrotoxic agents and monitor  4.  Hyponatremia and hypochloremia- continue IV fluids today, renal function is improving.  5.  Acute metabolic encephalopathy-secondary to sepsis.  D/c gabapentin and decrease her pain medication dose and monitor closely  6.  DVT prophylaxis- on Lovenox.  Physical therapy recommended home health   All the records are reviewed and case discussed with Care Management/Social Workerr. Management plans discussed with the patient, family and they are in agreement.  CODE STATUS: Full code  TOTAL TIME TAKING CARE OF THIS PATIENT: 36 minutes.   POSSIBLE D/C TOMORROW, DEPENDING ON CLINICAL CONDITION.   Gladstone Lighter M.D on 11/13/2017 at 10:58 AM  Between 7am  to 6pm - Pager - [phone]  After 6pm go to [email or web address] - password [email or web address] [facility]  Office  [phone]  CC: Primary care physician; Alvester Chou, NP

## 2017-11-14 LAB — CBC
HCT: 32.4 % — ABNORMAL LOW (ref 35.0–47.0)
HEMOGLOBIN: 10.9 g/dL — AB (ref 12.0–16.0)
MCH: 28.9 pg (ref 26.0–34.0)
MCHC: 33.8 g/dL (ref 32.0–36.0)
MCV: 85.5 fL (ref 80.0–100.0)
Platelets: 372 10*3/uL (ref 150–440)
RBC: 3.79 MIL/uL — ABNORMAL LOW (ref 3.80–5.20)
RDW: 14.5 % (ref 11.5–14.5)
WBC: 13 10*3/uL — AB (ref 3.6–11.0)

## 2017-11-14 LAB — BASIC METABOLIC PANEL
ANION GAP: 12 (ref 5–15)
Anion gap: 12 (ref 5–15)
BUN: 11 mg/dL (ref 6–20)
BUN: 12 mg/dL (ref 6–20)
CALCIUM: 8 mg/dL — AB (ref 8.9–10.3)
CHLORIDE: 90 mmol/L — AB (ref 101–111)
CHLORIDE: 87 mmol/L — AB (ref 101–111)
CO2: 25 mmol/L (ref 22–32)
CO2: 27 mmol/L (ref 22–32)
Calcium: 8 mg/dL — ABNORMAL LOW (ref 8.9–10.3)
Creatinine, Ser: 0.54 mg/dL (ref 0.44–1.00)
Creatinine, Ser: 0.73 mg/dL (ref 0.44–1.00)
GFR calc Af Amer: >60 mL/min (ref >60)
GFR calc non Af Amer: >60 mL/min (ref >60)
GFR calc non Af Amer: >60 mL/min (ref >60)
Glucose, Bld: 83 mg/dL (ref 65–99)
Glucose, Bld: 112 mg/dL — ABNORMAL HIGH (ref 65–99)
POTASSIUM: 3.3 mmol/L — AB (ref 3.5–5.1)
Potassium: 3.2 mmol/L — ABNORMAL LOW (ref 3.5–5.1)
SODIUM: 127 mmol/L — AB (ref 135–145)
SODIUM: 126 mmol/L — AB (ref 135–145)

## 2017-11-14 LAB — CULTURE, BLOOD (ROUTINE X 2): SPECIAL REQUESTS: ADEQUATE

## 2017-11-14 MED ORDER — NYSTATIN 100000 UNIT/ML MT SUSP
5.0000 mL | Freq: Four times a day (QID) | OROMUCOSAL | Status: DC
  Administered 2017-11-14 – 2017-11-15 (×5): 500000 [IU] via OROMUCOSAL
  Filled 2017-11-14 (×5): qty 5

## 2017-11-14 MED ORDER — POTASSIUM CHLORIDE CRYS ER 20 MEQ PO TBCR
40.0000 meq | EXTENDED_RELEASE_TABLET | Freq: Once | ORAL | Status: AC
  Administered 2017-11-14: 10:00:00 40 meq via ORAL
  Filled 2017-11-14: qty 2

## 2017-11-14 NOTE — Progress Notes (Signed)
[facility] [place] NAME: Kelsey Long    MR#:  [id number]  DATE OF BIRTH:  1946-05-16  SUBJECTIVE:   Patient doing okay this morning. Has generalized weakness. She feels better than she has in the past 2 days.  REVIEW OF SYSTEMS:    Review of Systems  Constitutional: Negative for fever, chills weight loss She has generalized weakness HENT: Negative for ear pain, nosebleeds, congestion, facial swelling, rhinorrhea, neck pain, neck stiffness and ear discharge.   Respiratory: No shortness of breath or wheezing. Positive for cough.  Cardiovascular: Negative for chest pain, palpitations and leg swelling.  Gastrointestinal: Negative for heartburn, abdominal pain, vomiting, diarrhea or consitpation Genitourinary: Negative for dysuria, urgency, frequency, hematuria Musculoskeletal: Negative for back pain or joint pain Neurological: Negative for dizziness, seizures, syncope, focal weakness,  numbness and headaches.  Hematological: Does not bruise/bleed easily.  Psychiatric/Behavioral: Negative for hallucinations, confusion, dysphoric mood    Tolerating Diet: yes      DRUG ALLERGIES:   Allergies  Allergen Reactions  . Penicillins Swelling    Has patient had a PCN reaction causing immediate rash, facial/tongue/throat swelling, SOB or lightheadedness with hypotension: No Has patient had a PCN reaction causing severe rash involving mucus membranes or skin necrosis: No Has patient had a PCN reaction that required hospitalization: No Has patient had a PCN reaction occurring within the last 10 years: No If all of the above answers are "NO", then may proceed with Cephalosporin use.   . Sulfa Drugs Cross Reactors Swelling  . Tetracyclines & Related Swelling  . Tramadol Rash    VITALS:  Blood pressure (!) 150/70, pulse 99, temperature 97.7 F (36.5 C), resp. rate (!) 21, height 5\' 2"  (1.575 m), weight 49.9 kg (110 lb), SpO2 92  %.  PHYSICAL EXAMINATION:  Constitutional: Appears well-developed and well-nourished. No distress. HENT: Normocephalic. Marland Kitchen Oropharynx is clear and moist.  Eyes: Conjunctivae and EOM are normal. PERRLA, no scleral icterus.  Neck: Normal ROM. Neck supple. No JVD. No tracheal deviation. CVS: RRR, S1/S2 +, no murmurs, no gallops, no carotid bruit.  Pulmonary: Effort and breath sounds normal, no stridor, rhonchi, wheezes, rales.  Abdominal: Soft. BS +,  no distension, tenderness, rebound or guarding.  Musculoskeletal: Normal range of motion. No edema and no tenderness.  Neuro: Alert. CN 2-12 grossly intact. No focal deficits. Skin: Skin is warm and dry. No rash noted. Psychiatric: Normal mood and affect.      LABORATORY PANEL:   CBC Recent Labs  Lab 11/14/17 0410  WBC 13.0*  HGB 10.9*  HCT 32.4*  PLT 372   ------------------------------------------------------------------------------------------------------------------  Chemistries  Recent Labs  Lab 11/14/17 1020  NA 126*  K 3.3*  CL 87*  CO2 27  GLUCOSE 112*  BUN 12  CREATININE 0.73  CALCIUM 8.0*   ------------------------------------------------------------------------------------------------------------------  Cardiac Enzymes Recent Labs  Lab 11/11/17 1312  TROPONINI <0.03   ------------------------------------------------------------------------------------------------------------------  RADIOLOGY:  No results found.   ASSESSMENT AND PLAN:   71 year old female with history of COPD not on oxygen who presents with shortness of breath.  1. Sepsis: Patient presented with leukocytosis, hypoxia and tachypnea. Sepsis is from community acquired pneumonia. Sepsis has resolved.  2. Acute hypoxic respiratory failure in the setting of community acquired pneumonia Wean oxygen as tolerated  3. Community acquired pneumonia: Continue Rocephin and azithromycin with plan to change to Levaquin at discharge. 4.  Hyponatremia: Patient is on fluids however with poor by mouth intake. I encouraged patient  to take in adequate diet. Will repeat BMP in a.m.  5. Acute metabolic encephalopathy in the setting of sepsis which has improved  Physical therapy is recommending home health at discharge     Management plans discussed with the patient and she is in agreement.  CODE STATUS: FULL  TOTAL TIME TAKING CARE OF THIS PATIENT: 26 minutes.     POSSIBLE D/C tomorrow, DEPENDING ON CLINICAL CONDITION.   Kelsey Long M.D on 11/14/2017 at 11:13 AM  Between 7am to 6pm - Pager - [phone] After 6pm go to [email or web address] - password [email or web address] [facility]  Office  [phone]  CC: Primary care physician; Kelsey Chou, NP  Note: This dictation was prepared with Dragon dictation along with smaller phrase technology. Any transcriptional errors that result from this process are unintentional.

## 2017-11-14 NOTE — Plan of Care (Signed)
  Progressing Education: Knowledge of General Education information will improve 11/14/2017 1030 - Progressing by Cherylann Parr, RN Health Behavior/Discharge Planning: Ability to manage health-related needs will improve 11/14/2017 1030 - Progressing by Cherylann Parr, RN Activity: Risk for activity intolerance will decrease 11/14/2017 1030 - Progressing by Cherylann Parr, RN Nutrition: Adequate nutrition will be maintained 11/14/2017 1030 - Progressing by Cherylann Parr, RN Coping: Level of anxiety will decrease 11/14/2017 1030 - Progressing by Cherylann Parr, RN Respiratory: Ability to maintain adequate ventilation will improve 11/14/2017 1030 - Progressing by Cherylann Parr, RN Ability to maintain a clear airway will improve 11/14/2017 1030 - Progressing by Cherylann Parr, RN

## 2017-11-15 LAB — BASIC METABOLIC PANEL
Anion gap: 9 (ref 5–15)
BUN: 11 mg/dL (ref 6–20)
CHLORIDE: 94 mmol/L — AB (ref 101–111)
CO2: 25 mmol/L (ref 22–32)
CREATININE: 0.58 mg/dL (ref 0.44–1.00)
Calcium: 7.9 mg/dL — ABNORMAL LOW (ref 8.9–10.3)
GFR calc Af Amer: >60 mL/min (ref >60)
GFR calc non Af Amer: >60 mL/min (ref >60)
GLUCOSE: 106 mg/dL — AB (ref 65–99)
Potassium: 3.6 mmol/L (ref 3.5–5.1)
Sodium: 128 mmol/L — ABNORMAL LOW (ref 135–145)

## 2017-11-15 MED ORDER — CLONIDINE HCL 0.1 MG PO TABS: 0.1000 mg | ORAL_TABLET | Freq: Two times a day (BID) | ORAL | 11 refills | Status: DC

## 2017-11-15 MED ORDER — LEVOFLOXACIN 750 MG PO TABS: 750.0000 mg | ORAL_TABLET | Freq: Every day | ORAL | 0 refills | Status: DC

## 2017-11-15 MED ORDER — BENZONATATE 100 MG PO CAPS: 100.0000 mg | ORAL_CAPSULE | Freq: Three times a day (TID) | ORAL | 0 refills | Status: DC

## 2017-11-15 NOTE — Progress Notes (Signed)
Pt has been discharged home. Discharge papers given and explained to pt and son. Son verbalized understanding. Meds and f/u appointment reviewed. RX given.

## 2017-11-15 NOTE — Progress Notes (Addendum)
Notified Dr Benjie Karvonen that pt has 2xstatins,zocor and lipitor, on discharge meds list. Per MD to cross out lipitor on discharge papers. MD to correct AVS later. Pt and son are aware. Explained to pt's son to continue zocor, verbalized understanding.

## 2017-11-15 NOTE — Care Management Note (Signed)
Case Management Note  Patient Details  Name: Kelsey Long MRN: [id number] Date of Birth: [birth year]  Subjective/Objective:   Admitted to [facility] with the diagnosis of pneumonia. Lives with son, Vicente Males [phone]). Last see Dr. Aris Lot about a month ago. Prescriptions are filled at [facility].  No home health. No skilled facility. No home oxygen. Rolling walker and 2-prong cane in the home. Takes care of all basic activities of daily living herself. Fair appetite. Falls in the past. Family will transport                 Action/Plan: Physical therapy evaluation completed. Recommending Home Health Pt and supervision.  [facility] chosen. Will update Jermane, [facility] presentative   Expected Discharge Date:  11/15/17               Expected Discharge Plan:     In-House Referral:   yes  Discharge planning Services     Post Acute Care Choice:   yes Choice offered to:   daughter  DME Arranged:    DME Agency:     HH Arranged:   yes HH Agency:   [facility]  Status of Service:     If discussed at [place] of Stay Meetings, dates discussed:    Additional Comments:  Shelbie Ammons, RN MSN CCM Care Management [phone] 11/15/2017, 12:19 PM

## 2017-11-15 NOTE — Discharge Summary (Addendum)
[facility] NAME: Kelsey Long    MR#:  [id number]  DATE OF BIRTH:  [birth year]  DATE OF ADMISSION:  11/11/2017 ADMITTING PHYSICIAN: Gorden Harms, MD  DATE OF DISCHARGE: 11/15/2017  PRIMARY CARE PHYSICIAN: Alvester Chou, NP    ADMISSION DIAGNOSIS:  Dizziness [R42] Community acquired pneumonia of right lower lobe of lung ([place]) [J18.1]  DISCHARGE DIAGNOSIS:  Active Problems:   CAP (community acquired pneumonia) strep bacteremia  SECONDARY DIAGNOSIS:   Past Medical History:  Diagnosis Date  . Anxiety   . Chronic pain   . COPD (chronic obstructive pulmonary disease) ([place])   . Hypertension   . Uterine cancer [facility])     HOSPITAL COURSE:   71 year old female with history of COPD not on oxygen who presents with shortness of breath.  1. Sepsis from strep bacteremia: Patient presented with leukocytosis, hypoxia and tachypnea. Sepsis is from community acquired pneumonia. Sepsis has resolved.  2. Acute hypoxic respiratory failure in the setting of community acquired pneumonia Wean oxygen as tolerated  3. Community acquired pneumonia and strep bacteremia: She will be discharged on Levaquin for 8 more days.  4. Hyponatremia: Patient's sodium level improving. She needs repeat BMP in 2 days I think this is from sepsis and poor po intake.  5. Acute metabolic encephalopathy in the setting of sepsis which has improved  PT RECS HHC   DISCHARGE CONDITIONS AND DIET:   Stable Cardiac diet  CONSULTS OBTAINED:    DRUG ALLERGIES:   Allergies  Allergen Reactions  . Penicillins Swelling    Has patient had a PCN reaction causing immediate rash, facial/tongue/throat swelling, SOB or lightheadedness with hypotension: No Has patient had a PCN reaction causing severe rash involving mucus membranes or skin necrosis: No Has patient had a PCN reaction that required hospitalization: No Has patient had a PCN reaction occurring  within the last 10 years: No If all of the above answers are "NO", then may proceed with Cephalosporin use.   . Sulfa Drugs Cross Reactors Swelling  . Tetracyclines & Related Swelling  . Tramadol Rash    DISCHARGE MEDICATIONS:   Discharge Medication List as of 11/15/2017  1:36 PM    START taking these medications   Details  benzonatate (TESSALON) 100 MG capsule Take 1 capsule (100 mg total) by mouth 3 (three) times daily., Starting Fri 11/15/2017, Normal      CONTINUE these medications which have CHANGED   Details  cloNIDine (CATAPRES) 0.1 MG tablet Take 1 tablet (0.1 mg total) by mouth 2 (two) times daily., Starting Fri 11/15/2017, Normal    levofloxacin (LEVAQUIN) 750 MG tablet Take 1 tablet (750 mg total) by mouth daily., Starting Fri 11/15/2017, Print      CONTINUE these medications which have NOT CHANGED   Details  albuterol (PROVENTIL HFA;VENTOLIN HFA) 108 (90 BASE) MCG/ACT inhaler Inhale 2 puffs into the lungs every 6 (six) hours as needed for wheezing or shortness of breath. , Until Discontinued, Historical Med    benazepril-hydrochlorthiazide (LOTENSIN HCT) 20-12.5 MG per tablet Take 1 tablet by mouth daily., Until Discontinued, Historical Med    budesonide-formoterol (SYMBICORT) 160-4.5 MCG/ACT inhaler Inhale 2 puffs into the lungs 2 (two) times daily., Until Discontinued, Historical Med    Cholecalciferol (VITAMIN D3) 2000 UNITS TABS Take 1 capsule by mouth daily., Until Discontinued, Historical Med    DULoxetine (CYMBALTA) 30 MG capsule Take 30 mg by mouth 2 (two) times daily., Until Discontinued, Historical Med  gabapentin (NEURONTIN) 600 MG tablet Take 600 mg by mouth at bedtime. , Until Discontinued, Historical Med    metoprolol succinate (TOPROL-XL) 50 MG 24 hr tablet Take 50 mg by mouth daily. Take with or immediately following a meal., Until Discontinued, Historical Med    montelukast (SINGULAIR) 10 MG tablet Take 10 mg by mouth at bedtime. , Starting  09/19/2012, Until Discontinued, Historical Med    MOVANTIK 25 MG TABS tablet Take 1 tablet daily by mouth., Starting Wed 10/16/2017, Historical Med    omeprazole (PRILOSEC) 20 MG capsule Take 20 mg by mouth daily., Until Discontinued, Historical Med    oxybutynin (DITROPAN-XL) 5 MG 24 hr tablet Take 2.5 mg 2 (two) times daily by mouth., Historical Med    potassium chloride (K-DUR) 10 MEQ tablet Take 10 mEq by mouth daily., Until Discontinued, Historical Med    simvastatin (ZOCOR) 20 MG tablet Take 1 tablet daily by mouth., Starting Sat 10/26/2017, Historical Med    tiotropium (SPIRIVA) 18 MCG inhalation capsule Place 18 mcg into inhaler and inhale daily., Until Discontinued, Historical Med    atorvastatin (LIPITOR) 20 MG tablet Take 20 mg by mouth daily., Until Discontinued, Historical Med    albuterol (PROVENTIL) (2.5 MG/3ML) 0.083% nebulizer solution Take 3 mLs (2.5 mg total) by nebulization every 6 (six) hours as needed for wheezing or shortness of breath., Starting 05/02/2015, Until Discontinued, Print    hydrOXYzine (ATARAX/VISTARIL) 25 MG tablet Take 1 tablet (25 mg total) by mouth every 6 (six) hours as needed., Starting Wed 12/28/2015, Print    oxyCODONE (ROXICODONE) 15 MG immediate release tablet Take 1 tablet every 6 (six) hours as needed by mouth., Starting Wed 11/06/2017, Historical Med    oxyCODONE-acetaminophen (PERCOCET) 10-325 MG tablet Take 1 tablet by mouth every 4 (four) hours as needed for pain., Historical Med      STOP taking these medications     naproxen (NAPROSYN) 500 MG tablet      clindamycin (CLEOCIN) 300 MG capsule      famotidine (PEPCID) 20 MG tablet      predniSONE (STERAPRED UNI-PAK 21 TAB) 10 MG (21) TBPK tablet           Today   CHIEF COMPLAINT:  Patient is not sure if she can get a ride home today. Her son is in the hospital   VITAL SIGNS:  Blood pressure (!) 144/68, pulse 98, temperature 98.6 F (37 C), temperature source Oral, resp. rate  18, height 5\' 2"  (1.575 m), weight 51.4 kg (113 lb 6.4 oz), SpO2 93 %.   REVIEW OF SYSTEMS:  Review of Systems  Constitutional: Negative for chills, fever and malaise/fatigue.  HENT: Negative.  Negative for ear discharge, ear pain, hearing loss, nosebleeds and sore throat.   Eyes: Negative.  Negative for blurred vision and pain.  Respiratory: Positive for cough. Negative for hemoptysis, shortness of breath and wheezing.   Cardiovascular: Negative.  Negative for chest pain, palpitations and leg swelling.  Gastrointestinal: Negative.  Negative for abdominal pain, blood in stool, diarrhea, nausea and vomiting.  Genitourinary: Negative.  Negative for dysuria.  Musculoskeletal: Negative.  Negative for back pain.  Skin: Negative.   Neurological: Positive for weakness. Negative for dizziness, tremors, speech change, focal weakness, seizures and headaches.  Endo/Heme/Allergies: Negative.  Does not bruise/bleed easily.  Psychiatric/Behavioral: Negative.  Negative for depression, hallucinations and suicidal ideas.     PHYSICAL EXAMINATION:  GENERAL:  71 y.o.-year-old patient lying in the bed with no acute distress.  NECK:  Supple, no jugular venous distention. No thyroid enlargement, no tenderness.  LUNGS: Normal breath sounds bilaterally, no wheezing, rales,rhonchi  No use of accessory muscles of respiration.  CARDIOVASCULAR: S1, S2 normal. No murmurs, rubs, or gallops.  ABDOMEN: Soft, non-tender, non-distended. Bowel sounds present. No organomegaly or mass.  EXTREMITIES: No pedal edema, cyanosis, or clubbing.  PSYCHIATRIC: The patient is alert and oriented x 3.  SKIN: No obvious rash, lesion, or ulcer.   DATA REVIEW:   CBC Recent Labs  Lab 11/14/17 0410  WBC 13.0*  HGB 10.9*  HCT 32.4*  PLT 372    Chemistries  Recent Labs  Lab 11/15/17 0446  NA 128*  K 3.6  CL 94*  CO2 25  GLUCOSE 106*  BUN 11  CREATININE 0.58  CALCIUM 7.9*    Cardiac Enzymes Recent Labs  Lab  11/11/17 1312  TROPONINI <0.03    Microbiology Results  @MICRORSLT48 @  RADIOLOGY:  No results found.    Discharge Medication List as of 11/15/2017  1:36 PM    START taking these medications   Details  benzonatate (TESSALON) 100 MG capsule Take 1 capsule (100 mg total) by mouth 3 (three) times daily., Starting Fri 11/15/2017, Normal      CONTINUE these medications which have CHANGED   Details  cloNIDine (CATAPRES) 0.1 MG tablet Take 1 tablet (0.1 mg total) by mouth 2 (two) times daily., Starting Fri 11/15/2017, Normal    levofloxacin (LEVAQUIN) 750 MG tablet Take 1 tablet (750 mg total) by mouth daily., Starting Fri 11/15/2017, Print      CONTINUE these medications which have NOT CHANGED   Details  albuterol (PROVENTIL HFA;VENTOLIN HFA) 108 (90 BASE) MCG/ACT inhaler Inhale 2 puffs into the lungs every 6 (six) hours as needed for wheezing or shortness of breath. , Until Discontinued, Historical Med    benazepril-hydrochlorthiazide (LOTENSIN HCT) 20-12.5 MG per tablet Take 1 tablet by mouth daily., Until Discontinued, Historical Med    budesonide-formoterol (SYMBICORT) 160-4.5 MCG/ACT inhaler Inhale 2 puffs into the lungs 2 (two) times daily., Until Discontinued, Historical Med    Cholecalciferol (VITAMIN D3) 2000 UNITS TABS Take 1 capsule by mouth daily., Until Discontinued, Historical Med    DULoxetine (CYMBALTA) 30 MG capsule Take 30 mg by mouth 2 (two) times daily., Until Discontinued, Historical Med    gabapentin (NEURONTIN) 600 MG tablet Take 600 mg by mouth at bedtime. , Until Discontinued, Historical Med    metoprolol succinate (TOPROL-XL) 50 MG 24 hr tablet Take 50 mg by mouth daily. Take with or immediately following a meal., Until Discontinued, Historical Med    montelukast (SINGULAIR) 10 MG tablet Take 10 mg by mouth at bedtime. , Starting 09/19/2012, Until Discontinued, Historical Med    MOVANTIK 25 MG TABS tablet Take 1 tablet daily by mouth., Starting Wed  10/16/2017, Historical Med    omeprazole (PRILOSEC) 20 MG capsule Take 20 mg by mouth daily., Until Discontinued, Historical Med    oxybutynin (DITROPAN-XL) 5 MG 24 hr tablet Take 2.5 mg 2 (two) times daily by mouth., Historical Med    potassium chloride (K-DUR) 10 MEQ tablet Take 10 mEq by mouth daily., Until Discontinued, Historical Med    simvastatin (ZOCOR) 20 MG tablet Take 1 tablet daily by mouth., Starting Sat 10/26/2017, Historical Med    tiotropium (SPIRIVA) 18 MCG inhalation capsule Place 18 mcg into inhaler and inhale daily., Until Discontinued, Historical Med    atorvastatin (LIPITOR) 20 MG tablet Take 20 mg by mouth daily., Until Discontinued, Historical Med  albuterol (PROVENTIL) (2.5 MG/3ML) 0.083% nebulizer solution Take 3 mLs (2.5 mg total) by nebulization every 6 (six) hours as needed for wheezing or shortness of breath., Starting 05/02/2015, Until Discontinued, Print    hydrOXYzine (ATARAX/VISTARIL) 25 MG tablet Take 1 tablet (25 mg total) by mouth every 6 (six) hours as needed., Starting Wed 12/28/2015, Print    oxyCODONE (ROXICODONE) 15 MG immediate release tablet Take 1 tablet every 6 (six) hours as needed by mouth., Starting Wed 11/06/2017, Historical Med    oxyCODONE-acetaminophen (PERCOCET) 10-325 MG tablet Take 1 tablet by mouth every 4 (four) hours as needed for pain., Historical Med      STOP taking these medications     naproxen (NAPROSYN) 500 MG tablet      clindamycin (CLEOCIN) 300 MG capsule      famotidine (PEPCID) 20 MG tablet      predniSONE (STERAPRED UNI-PAK 21 TAB) 10 MG (21) TBPK tablet            Management plans discussed with the patient and she is in agreement. Stable for discharge   Patient should follow up with pcp  CODE STATUS:     Code Status Orders  (From admission, onward)        Start     Ordered   11/11/17 1436  Full code  Continuous     11/11/17 1435    Code Status History    Date Active Date Inactive Code Status  Order ID Comments User Context   This patient has a current code status but no historical code status.      TOTAL TIME TAKING CARE OF THIS PATIENT: 38 minutes.    Note: This dictation was prepared with Dragon dictation along with smaller phrase technology. Any transcriptional errors that result from this process are unintentional.  Primrose Oler M.D on 11/15/2017 at 3:16 PM  Between 7am to 6pm - Pager - [phone] After 6pm go to [email or web address] - password [email or web address] [facility]  Office  [phone]  CC: Primary care physician; Alvester Chou, NP

## 2017-11-21 DIAGNOSIS — Z87891 Personal history of nicotine dependence: Secondary | ICD-10-CM | POA: Diagnosis not present

## 2017-11-21 DIAGNOSIS — I10 Essential (primary) hypertension: Secondary | ICD-10-CM | POA: Diagnosis not present

## 2017-11-21 DIAGNOSIS — Z90710 Acquired absence of both cervix and uterus: Secondary | ICD-10-CM | POA: Diagnosis not present

## 2017-11-21 DIAGNOSIS — J440 Chronic obstructive pulmonary disease with acute lower respiratory infection: Secondary | ICD-10-CM | POA: Diagnosis not present

## 2017-11-21 DIAGNOSIS — F419 Anxiety disorder, unspecified: Secondary | ICD-10-CM | POA: Diagnosis not present

## 2017-11-21 DIAGNOSIS — J181 Lobar pneumonia, unspecified organism: Secondary | ICD-10-CM | POA: Diagnosis not present

## 2017-11-21 DIAGNOSIS — G8929 Other chronic pain: Secondary | ICD-10-CM | POA: Diagnosis not present

## 2017-11-21 DIAGNOSIS — Z8542 Personal history of malignant neoplasm of other parts of uterus: Secondary | ICD-10-CM | POA: Diagnosis not present

## 2017-11-25 DIAGNOSIS — J440 Chronic obstructive pulmonary disease with acute lower respiratory infection: Secondary | ICD-10-CM | POA: Diagnosis not present

## 2017-11-25 DIAGNOSIS — R634 Abnormal weight loss: Secondary | ICD-10-CM | POA: Diagnosis not present

## 2017-11-25 DIAGNOSIS — F419 Anxiety disorder, unspecified: Secondary | ICD-10-CM | POA: Diagnosis not present

## 2017-11-25 DIAGNOSIS — I10 Essential (primary) hypertension: Secondary | ICD-10-CM | POA: Diagnosis not present

## 2017-11-25 DIAGNOSIS — J181 Lobar pneumonia, unspecified organism: Secondary | ICD-10-CM | POA: Diagnosis not present

## 2017-11-25 DIAGNOSIS — G8929 Other chronic pain: Secondary | ICD-10-CM | POA: Diagnosis not present

## 2017-11-25 DIAGNOSIS — R0902 Hypoxemia: Secondary | ICD-10-CM | POA: Diagnosis not present

## 2017-11-25 DIAGNOSIS — R0602 Shortness of breath: Secondary | ICD-10-CM | POA: Diagnosis not present

## 2017-11-25 DIAGNOSIS — J189 Pneumonia, unspecified organism: Secondary | ICD-10-CM | POA: Diagnosis not present

## 2017-11-25 DIAGNOSIS — Z90710 Acquired absence of both cervix and uterus: Secondary | ICD-10-CM | POA: Diagnosis not present

## 2017-11-25 DIAGNOSIS — Z8542 Personal history of malignant neoplasm of other parts of uterus: Secondary | ICD-10-CM | POA: Diagnosis not present

## 2017-11-25 DIAGNOSIS — Z87891 Personal history of nicotine dependence: Secondary | ICD-10-CM | POA: Diagnosis not present

## 2017-11-25 DIAGNOSIS — R05 Cough: Secondary | ICD-10-CM | POA: Diagnosis not present

## 2017-11-26 DIAGNOSIS — Z87891 Personal history of nicotine dependence: Secondary | ICD-10-CM | POA: Diagnosis not present

## 2017-11-26 DIAGNOSIS — J181 Lobar pneumonia, unspecified organism: Secondary | ICD-10-CM | POA: Diagnosis not present

## 2017-11-26 DIAGNOSIS — G8929 Other chronic pain: Secondary | ICD-10-CM | POA: Diagnosis not present

## 2017-11-26 DIAGNOSIS — Z8542 Personal history of malignant neoplasm of other parts of uterus: Secondary | ICD-10-CM | POA: Diagnosis not present

## 2017-11-26 DIAGNOSIS — J440 Chronic obstructive pulmonary disease with acute lower respiratory infection: Secondary | ICD-10-CM | POA: Diagnosis not present

## 2017-11-26 DIAGNOSIS — F419 Anxiety disorder, unspecified: Secondary | ICD-10-CM | POA: Diagnosis not present

## 2017-11-26 DIAGNOSIS — Z90710 Acquired absence of both cervix and uterus: Secondary | ICD-10-CM | POA: Diagnosis not present

## 2017-11-26 DIAGNOSIS — I10 Essential (primary) hypertension: Secondary | ICD-10-CM | POA: Diagnosis not present

## 2017-11-27 DIAGNOSIS — Z90710 Acquired absence of both cervix and uterus: Secondary | ICD-10-CM | POA: Diagnosis not present

## 2017-11-27 DIAGNOSIS — J181 Lobar pneumonia, unspecified organism: Secondary | ICD-10-CM | POA: Diagnosis not present

## 2017-11-27 DIAGNOSIS — G8929 Other chronic pain: Secondary | ICD-10-CM | POA: Diagnosis not present

## 2017-11-27 DIAGNOSIS — F419 Anxiety disorder, unspecified: Secondary | ICD-10-CM | POA: Diagnosis not present

## 2017-11-27 DIAGNOSIS — Z8542 Personal history of malignant neoplasm of other parts of uterus: Secondary | ICD-10-CM | POA: Diagnosis not present

## 2017-11-27 DIAGNOSIS — Z87891 Personal history of nicotine dependence: Secondary | ICD-10-CM | POA: Diagnosis not present

## 2017-11-27 DIAGNOSIS — I10 Essential (primary) hypertension: Secondary | ICD-10-CM | POA: Diagnosis not present

## 2017-11-27 DIAGNOSIS — J440 Chronic obstructive pulmonary disease with acute lower respiratory infection: Secondary | ICD-10-CM | POA: Diagnosis not present

## 2017-11-28 DIAGNOSIS — Z87891 Personal history of nicotine dependence: Secondary | ICD-10-CM | POA: Diagnosis not present

## 2017-11-28 DIAGNOSIS — J181 Lobar pneumonia, unspecified organism: Secondary | ICD-10-CM | POA: Diagnosis not present

## 2017-11-28 DIAGNOSIS — Z90710 Acquired absence of both cervix and uterus: Secondary | ICD-10-CM | POA: Diagnosis not present

## 2017-11-28 DIAGNOSIS — Z8542 Personal history of malignant neoplasm of other parts of uterus: Secondary | ICD-10-CM | POA: Diagnosis not present

## 2017-11-28 DIAGNOSIS — F419 Anxiety disorder, unspecified: Secondary | ICD-10-CM | POA: Diagnosis not present

## 2017-11-28 DIAGNOSIS — J440 Chronic obstructive pulmonary disease with acute lower respiratory infection: Secondary | ICD-10-CM | POA: Diagnosis not present

## 2017-11-28 DIAGNOSIS — I10 Essential (primary) hypertension: Secondary | ICD-10-CM | POA: Diagnosis not present

## 2017-11-28 DIAGNOSIS — G8929 Other chronic pain: Secondary | ICD-10-CM | POA: Diagnosis not present

## 2017-11-29 DIAGNOSIS — Z87891 Personal history of nicotine dependence: Secondary | ICD-10-CM | POA: Diagnosis not present

## 2017-11-29 DIAGNOSIS — F419 Anxiety disorder, unspecified: Secondary | ICD-10-CM | POA: Diagnosis not present

## 2017-11-29 DIAGNOSIS — J440 Chronic obstructive pulmonary disease with acute lower respiratory infection: Secondary | ICD-10-CM | POA: Diagnosis not present

## 2017-11-29 DIAGNOSIS — Z8542 Personal history of malignant neoplasm of other parts of uterus: Secondary | ICD-10-CM | POA: Diagnosis not present

## 2017-11-29 DIAGNOSIS — J181 Lobar pneumonia, unspecified organism: Secondary | ICD-10-CM | POA: Diagnosis not present

## 2017-11-29 DIAGNOSIS — G8929 Other chronic pain: Secondary | ICD-10-CM | POA: Diagnosis not present

## 2017-11-29 DIAGNOSIS — Z90710 Acquired absence of both cervix and uterus: Secondary | ICD-10-CM | POA: Diagnosis not present

## 2017-11-29 DIAGNOSIS — I10 Essential (primary) hypertension: Secondary | ICD-10-CM | POA: Diagnosis not present

## 2017-12-03 DIAGNOSIS — Z87891 Personal history of nicotine dependence: Secondary | ICD-10-CM | POA: Diagnosis not present

## 2017-12-03 DIAGNOSIS — J181 Lobar pneumonia, unspecified organism: Secondary | ICD-10-CM | POA: Diagnosis not present

## 2017-12-03 DIAGNOSIS — G8929 Other chronic pain: Secondary | ICD-10-CM | POA: Diagnosis not present

## 2017-12-03 DIAGNOSIS — F419 Anxiety disorder, unspecified: Secondary | ICD-10-CM | POA: Diagnosis not present

## 2017-12-03 DIAGNOSIS — Z8542 Personal history of malignant neoplasm of other parts of uterus: Secondary | ICD-10-CM | POA: Diagnosis not present

## 2017-12-03 DIAGNOSIS — I10 Essential (primary) hypertension: Secondary | ICD-10-CM | POA: Diagnosis not present

## 2017-12-03 DIAGNOSIS — J440 Chronic obstructive pulmonary disease with acute lower respiratory infection: Secondary | ICD-10-CM | POA: Diagnosis not present

## 2017-12-03 DIAGNOSIS — Z90710 Acquired absence of both cervix and uterus: Secondary | ICD-10-CM | POA: Diagnosis not present

## 2017-12-04 DIAGNOSIS — G8929 Other chronic pain: Secondary | ICD-10-CM | POA: Diagnosis not present

## 2017-12-04 DIAGNOSIS — Z90710 Acquired absence of both cervix and uterus: Secondary | ICD-10-CM | POA: Diagnosis not present

## 2017-12-04 DIAGNOSIS — J440 Chronic obstructive pulmonary disease with acute lower respiratory infection: Secondary | ICD-10-CM | POA: Diagnosis not present

## 2017-12-04 DIAGNOSIS — Z8542 Personal history of malignant neoplasm of other parts of uterus: Secondary | ICD-10-CM | POA: Diagnosis not present

## 2017-12-04 DIAGNOSIS — F419 Anxiety disorder, unspecified: Secondary | ICD-10-CM | POA: Diagnosis not present

## 2017-12-04 DIAGNOSIS — Z87891 Personal history of nicotine dependence: Secondary | ICD-10-CM | POA: Diagnosis not present

## 2017-12-04 DIAGNOSIS — J181 Lobar pneumonia, unspecified organism: Secondary | ICD-10-CM | POA: Diagnosis not present

## 2017-12-04 DIAGNOSIS — I10 Essential (primary) hypertension: Secondary | ICD-10-CM | POA: Diagnosis not present

## 2017-12-05 DIAGNOSIS — E119 Type 2 diabetes mellitus without complications: Secondary | ICD-10-CM | POA: Diagnosis not present

## 2017-12-05 DIAGNOSIS — J189 Pneumonia, unspecified organism: Secondary | ICD-10-CM | POA: Diagnosis not present

## 2017-12-05 DIAGNOSIS — J449 Chronic obstructive pulmonary disease, unspecified: Secondary | ICD-10-CM | POA: Diagnosis not present

## 2017-12-05 DIAGNOSIS — F419 Anxiety disorder, unspecified: Secondary | ICD-10-CM | POA: Diagnosis not present

## 2017-12-05 DIAGNOSIS — G8929 Other chronic pain: Secondary | ICD-10-CM | POA: Diagnosis not present

## 2017-12-05 DIAGNOSIS — R05 Cough: Secondary | ICD-10-CM | POA: Diagnosis not present

## 2017-12-05 DIAGNOSIS — Z87891 Personal history of nicotine dependence: Secondary | ICD-10-CM | POA: Diagnosis not present

## 2017-12-05 DIAGNOSIS — Z90710 Acquired absence of both cervix and uterus: Secondary | ICD-10-CM | POA: Diagnosis not present

## 2017-12-05 DIAGNOSIS — I10 Essential (primary) hypertension: Secondary | ICD-10-CM | POA: Diagnosis not present

## 2017-12-05 DIAGNOSIS — E1165 Type 2 diabetes mellitus with hyperglycemia: Secondary | ICD-10-CM | POA: Diagnosis not present

## 2017-12-05 DIAGNOSIS — J440 Chronic obstructive pulmonary disease with acute lower respiratory infection: Secondary | ICD-10-CM | POA: Diagnosis not present

## 2017-12-05 DIAGNOSIS — J181 Lobar pneumonia, unspecified organism: Secondary | ICD-10-CM | POA: Diagnosis not present

## 2017-12-05 DIAGNOSIS — Z8542 Personal history of malignant neoplasm of other parts of uterus: Secondary | ICD-10-CM | POA: Diagnosis not present

## 2017-12-06 DIAGNOSIS — G8929 Other chronic pain: Secondary | ICD-10-CM | POA: Diagnosis not present

## 2017-12-06 DIAGNOSIS — J181 Lobar pneumonia, unspecified organism: Secondary | ICD-10-CM | POA: Diagnosis not present

## 2017-12-06 DIAGNOSIS — F419 Anxiety disorder, unspecified: Secondary | ICD-10-CM | POA: Diagnosis not present

## 2017-12-06 DIAGNOSIS — Z87891 Personal history of nicotine dependence: Secondary | ICD-10-CM | POA: Diagnosis not present

## 2017-12-06 DIAGNOSIS — J440 Chronic obstructive pulmonary disease with acute lower respiratory infection: Secondary | ICD-10-CM | POA: Diagnosis not present

## 2017-12-06 DIAGNOSIS — Z90710 Acquired absence of both cervix and uterus: Secondary | ICD-10-CM | POA: Diagnosis not present

## 2017-12-06 DIAGNOSIS — I10 Essential (primary) hypertension: Secondary | ICD-10-CM | POA: Diagnosis not present

## 2017-12-06 DIAGNOSIS — Z8542 Personal history of malignant neoplasm of other parts of uterus: Secondary | ICD-10-CM | POA: Diagnosis not present

## 2017-12-09 DIAGNOSIS — Z79891 Long term (current) use of opiate analgesic: Secondary | ICD-10-CM | POA: Diagnosis not present

## 2017-12-09 DIAGNOSIS — M542 Cervicalgia: Secondary | ICD-10-CM | POA: Diagnosis not present

## 2017-12-09 DIAGNOSIS — M79605 Pain in left leg: Secondary | ICD-10-CM | POA: Diagnosis not present

## 2017-12-09 DIAGNOSIS — M545 Low back pain: Secondary | ICD-10-CM | POA: Diagnosis not present

## 2017-12-09 DIAGNOSIS — G894 Chronic pain syndrome: Secondary | ICD-10-CM | POA: Diagnosis not present

## 2017-12-10 DIAGNOSIS — I10 Essential (primary) hypertension: Secondary | ICD-10-CM | POA: Diagnosis not present

## 2017-12-10 DIAGNOSIS — Z8542 Personal history of malignant neoplasm of other parts of uterus: Secondary | ICD-10-CM | POA: Diagnosis not present

## 2017-12-10 DIAGNOSIS — Z87891 Personal history of nicotine dependence: Secondary | ICD-10-CM | POA: Diagnosis not present

## 2017-12-10 DIAGNOSIS — J440 Chronic obstructive pulmonary disease with acute lower respiratory infection: Secondary | ICD-10-CM | POA: Diagnosis not present

## 2017-12-10 DIAGNOSIS — J181 Lobar pneumonia, unspecified organism: Secondary | ICD-10-CM | POA: Diagnosis not present

## 2017-12-10 DIAGNOSIS — F419 Anxiety disorder, unspecified: Secondary | ICD-10-CM | POA: Diagnosis not present

## 2017-12-10 DIAGNOSIS — G8929 Other chronic pain: Secondary | ICD-10-CM | POA: Diagnosis not present

## 2017-12-10 DIAGNOSIS — Z90710 Acquired absence of both cervix and uterus: Secondary | ICD-10-CM | POA: Diagnosis not present

## 2017-12-12 DIAGNOSIS — Z90710 Acquired absence of both cervix and uterus: Secondary | ICD-10-CM | POA: Diagnosis not present

## 2017-12-12 DIAGNOSIS — Z87891 Personal history of nicotine dependence: Secondary | ICD-10-CM | POA: Diagnosis not present

## 2017-12-12 DIAGNOSIS — G8929 Other chronic pain: Secondary | ICD-10-CM | POA: Diagnosis not present

## 2017-12-12 DIAGNOSIS — J440 Chronic obstructive pulmonary disease with acute lower respiratory infection: Secondary | ICD-10-CM | POA: Diagnosis not present

## 2017-12-12 DIAGNOSIS — Z8542 Personal history of malignant neoplasm of other parts of uterus: Secondary | ICD-10-CM | POA: Diagnosis not present

## 2017-12-12 DIAGNOSIS — I10 Essential (primary) hypertension: Secondary | ICD-10-CM | POA: Diagnosis not present

## 2017-12-12 DIAGNOSIS — J181 Lobar pneumonia, unspecified organism: Secondary | ICD-10-CM | POA: Diagnosis not present

## 2017-12-12 DIAGNOSIS — F419 Anxiety disorder, unspecified: Secondary | ICD-10-CM | POA: Diagnosis not present

## 2017-12-13 DIAGNOSIS — J440 Chronic obstructive pulmonary disease with acute lower respiratory infection: Secondary | ICD-10-CM | POA: Diagnosis not present

## 2017-12-13 DIAGNOSIS — G8929 Other chronic pain: Secondary | ICD-10-CM | POA: Diagnosis not present

## 2017-12-13 DIAGNOSIS — I10 Essential (primary) hypertension: Secondary | ICD-10-CM | POA: Diagnosis not present

## 2017-12-13 DIAGNOSIS — F419 Anxiety disorder, unspecified: Secondary | ICD-10-CM | POA: Diagnosis not present

## 2017-12-13 DIAGNOSIS — Z90710 Acquired absence of both cervix and uterus: Secondary | ICD-10-CM | POA: Diagnosis not present

## 2017-12-13 DIAGNOSIS — Z87891 Personal history of nicotine dependence: Secondary | ICD-10-CM | POA: Diagnosis not present

## 2017-12-13 DIAGNOSIS — Z8542 Personal history of malignant neoplasm of other parts of uterus: Secondary | ICD-10-CM | POA: Diagnosis not present

## 2017-12-13 DIAGNOSIS — J181 Lobar pneumonia, unspecified organism: Secondary | ICD-10-CM | POA: Diagnosis not present

## 2017-12-16 DIAGNOSIS — Z90710 Acquired absence of both cervix and uterus: Secondary | ICD-10-CM | POA: Diagnosis not present

## 2017-12-16 DIAGNOSIS — J440 Chronic obstructive pulmonary disease with acute lower respiratory infection: Secondary | ICD-10-CM | POA: Diagnosis not present

## 2017-12-16 DIAGNOSIS — G8929 Other chronic pain: Secondary | ICD-10-CM | POA: Diagnosis not present

## 2017-12-16 DIAGNOSIS — I10 Essential (primary) hypertension: Secondary | ICD-10-CM | POA: Diagnosis not present

## 2017-12-16 DIAGNOSIS — Z8542 Personal history of malignant neoplasm of other parts of uterus: Secondary | ICD-10-CM | POA: Diagnosis not present

## 2017-12-16 DIAGNOSIS — F419 Anxiety disorder, unspecified: Secondary | ICD-10-CM | POA: Diagnosis not present

## 2017-12-16 DIAGNOSIS — Z87891 Personal history of nicotine dependence: Secondary | ICD-10-CM | POA: Diagnosis not present

## 2017-12-16 DIAGNOSIS — J181 Lobar pneumonia, unspecified organism: Secondary | ICD-10-CM | POA: Diagnosis not present

## 2017-12-19 DIAGNOSIS — I10 Essential (primary) hypertension: Secondary | ICD-10-CM | POA: Diagnosis not present

## 2017-12-19 DIAGNOSIS — G8929 Other chronic pain: Secondary | ICD-10-CM | POA: Diagnosis not present

## 2017-12-19 DIAGNOSIS — Z8542 Personal history of malignant neoplasm of other parts of uterus: Secondary | ICD-10-CM | POA: Diagnosis not present

## 2017-12-19 DIAGNOSIS — Z87891 Personal history of nicotine dependence: Secondary | ICD-10-CM | POA: Diagnosis not present

## 2017-12-19 DIAGNOSIS — F419 Anxiety disorder, unspecified: Secondary | ICD-10-CM | POA: Diagnosis not present

## 2017-12-19 DIAGNOSIS — J181 Lobar pneumonia, unspecified organism: Secondary | ICD-10-CM | POA: Diagnosis not present

## 2017-12-19 DIAGNOSIS — J440 Chronic obstructive pulmonary disease with acute lower respiratory infection: Secondary | ICD-10-CM | POA: Diagnosis not present

## 2017-12-19 DIAGNOSIS — Z90710 Acquired absence of both cervix and uterus: Secondary | ICD-10-CM | POA: Diagnosis not present

## 2017-12-20 DIAGNOSIS — Z87891 Personal history of nicotine dependence: Secondary | ICD-10-CM | POA: Diagnosis not present

## 2017-12-20 DIAGNOSIS — J181 Lobar pneumonia, unspecified organism: Secondary | ICD-10-CM | POA: Diagnosis not present

## 2017-12-20 DIAGNOSIS — Z8542 Personal history of malignant neoplasm of other parts of uterus: Secondary | ICD-10-CM | POA: Diagnosis not present

## 2017-12-20 DIAGNOSIS — I10 Essential (primary) hypertension: Secondary | ICD-10-CM | POA: Diagnosis not present

## 2017-12-20 DIAGNOSIS — F419 Anxiety disorder, unspecified: Secondary | ICD-10-CM | POA: Diagnosis not present

## 2017-12-20 DIAGNOSIS — G8929 Other chronic pain: Secondary | ICD-10-CM | POA: Diagnosis not present

## 2017-12-20 DIAGNOSIS — Z90710 Acquired absence of both cervix and uterus: Secondary | ICD-10-CM | POA: Diagnosis not present

## 2017-12-20 DIAGNOSIS — J440 Chronic obstructive pulmonary disease with acute lower respiratory infection: Secondary | ICD-10-CM | POA: Diagnosis not present

## 2017-12-23 DIAGNOSIS — F419 Anxiety disorder, unspecified: Secondary | ICD-10-CM | POA: Diagnosis not present

## 2017-12-23 DIAGNOSIS — Z90710 Acquired absence of both cervix and uterus: Secondary | ICD-10-CM | POA: Diagnosis not present

## 2017-12-23 DIAGNOSIS — Z8542 Personal history of malignant neoplasm of other parts of uterus: Secondary | ICD-10-CM | POA: Diagnosis not present

## 2017-12-23 DIAGNOSIS — Z87891 Personal history of nicotine dependence: Secondary | ICD-10-CM | POA: Diagnosis not present

## 2017-12-23 DIAGNOSIS — I10 Essential (primary) hypertension: Secondary | ICD-10-CM | POA: Diagnosis not present

## 2017-12-23 DIAGNOSIS — G8929 Other chronic pain: Secondary | ICD-10-CM | POA: Diagnosis not present

## 2017-12-23 DIAGNOSIS — J181 Lobar pneumonia, unspecified organism: Secondary | ICD-10-CM | POA: Diagnosis not present

## 2017-12-23 DIAGNOSIS — J440 Chronic obstructive pulmonary disease with acute lower respiratory infection: Secondary | ICD-10-CM | POA: Diagnosis not present

## 2017-12-25 DIAGNOSIS — Z87891 Personal history of nicotine dependence: Secondary | ICD-10-CM | POA: Diagnosis not present

## 2017-12-25 DIAGNOSIS — Z90710 Acquired absence of both cervix and uterus: Secondary | ICD-10-CM | POA: Diagnosis not present

## 2017-12-25 DIAGNOSIS — I10 Essential (primary) hypertension: Secondary | ICD-10-CM | POA: Diagnosis not present

## 2017-12-25 DIAGNOSIS — Z8542 Personal history of malignant neoplasm of other parts of uterus: Secondary | ICD-10-CM | POA: Diagnosis not present

## 2017-12-25 DIAGNOSIS — J181 Lobar pneumonia, unspecified organism: Secondary | ICD-10-CM | POA: Diagnosis not present

## 2017-12-25 DIAGNOSIS — G8929 Other chronic pain: Secondary | ICD-10-CM | POA: Diagnosis not present

## 2017-12-25 DIAGNOSIS — J440 Chronic obstructive pulmonary disease with acute lower respiratory infection: Secondary | ICD-10-CM | POA: Diagnosis not present

## 2017-12-25 DIAGNOSIS — F419 Anxiety disorder, unspecified: Secondary | ICD-10-CM | POA: Diagnosis not present

## 2017-12-30 DIAGNOSIS — F419 Anxiety disorder, unspecified: Secondary | ICD-10-CM | POA: Diagnosis not present

## 2017-12-30 DIAGNOSIS — Z87891 Personal history of nicotine dependence: Secondary | ICD-10-CM | POA: Diagnosis not present

## 2017-12-30 DIAGNOSIS — I10 Essential (primary) hypertension: Secondary | ICD-10-CM | POA: Diagnosis not present

## 2017-12-30 DIAGNOSIS — Z8542 Personal history of malignant neoplasm of other parts of uterus: Secondary | ICD-10-CM | POA: Diagnosis not present

## 2017-12-30 DIAGNOSIS — G8929 Other chronic pain: Secondary | ICD-10-CM | POA: Diagnosis not present

## 2017-12-30 DIAGNOSIS — Z90710 Acquired absence of both cervix and uterus: Secondary | ICD-10-CM | POA: Diagnosis not present

## 2017-12-30 DIAGNOSIS — J440 Chronic obstructive pulmonary disease with acute lower respiratory infection: Secondary | ICD-10-CM | POA: Diagnosis not present

## 2017-12-30 DIAGNOSIS — J181 Lobar pneumonia, unspecified organism: Secondary | ICD-10-CM | POA: Diagnosis not present

## 2017-12-31 DIAGNOSIS — I10 Essential (primary) hypertension: Secondary | ICD-10-CM | POA: Diagnosis not present

## 2017-12-31 DIAGNOSIS — Z90710 Acquired absence of both cervix and uterus: Secondary | ICD-10-CM | POA: Diagnosis not present

## 2017-12-31 DIAGNOSIS — Z87891 Personal history of nicotine dependence: Secondary | ICD-10-CM | POA: Diagnosis not present

## 2017-12-31 DIAGNOSIS — J181 Lobar pneumonia, unspecified organism: Secondary | ICD-10-CM | POA: Diagnosis not present

## 2017-12-31 DIAGNOSIS — J440 Chronic obstructive pulmonary disease with acute lower respiratory infection: Secondary | ICD-10-CM | POA: Diagnosis not present

## 2017-12-31 DIAGNOSIS — G8929 Other chronic pain: Secondary | ICD-10-CM | POA: Diagnosis not present

## 2017-12-31 DIAGNOSIS — Z8542 Personal history of malignant neoplasm of other parts of uterus: Secondary | ICD-10-CM | POA: Diagnosis not present

## 2017-12-31 DIAGNOSIS — F419 Anxiety disorder, unspecified: Secondary | ICD-10-CM | POA: Diagnosis not present

## 2018-01-06 DIAGNOSIS — F419 Anxiety disorder, unspecified: Secondary | ICD-10-CM | POA: Diagnosis not present

## 2018-01-06 DIAGNOSIS — Z90710 Acquired absence of both cervix and uterus: Secondary | ICD-10-CM | POA: Diagnosis not present

## 2018-01-06 DIAGNOSIS — I10 Essential (primary) hypertension: Secondary | ICD-10-CM | POA: Diagnosis not present

## 2018-01-06 DIAGNOSIS — J440 Chronic obstructive pulmonary disease with acute lower respiratory infection: Secondary | ICD-10-CM | POA: Diagnosis not present

## 2018-01-06 DIAGNOSIS — Z8542 Personal history of malignant neoplasm of other parts of uterus: Secondary | ICD-10-CM | POA: Diagnosis not present

## 2018-01-06 DIAGNOSIS — Z87891 Personal history of nicotine dependence: Secondary | ICD-10-CM | POA: Diagnosis not present

## 2018-01-06 DIAGNOSIS — J181 Lobar pneumonia, unspecified organism: Secondary | ICD-10-CM | POA: Diagnosis not present

## 2018-01-06 DIAGNOSIS — G8929 Other chronic pain: Secondary | ICD-10-CM | POA: Diagnosis not present

## 2018-01-09 DIAGNOSIS — J181 Lobar pneumonia, unspecified organism: Secondary | ICD-10-CM | POA: Diagnosis not present

## 2018-01-09 DIAGNOSIS — I10 Essential (primary) hypertension: Secondary | ICD-10-CM | POA: Diagnosis not present

## 2018-01-09 DIAGNOSIS — Z87891 Personal history of nicotine dependence: Secondary | ICD-10-CM | POA: Diagnosis not present

## 2018-01-09 DIAGNOSIS — Z8542 Personal history of malignant neoplasm of other parts of uterus: Secondary | ICD-10-CM | POA: Diagnosis not present

## 2018-01-09 DIAGNOSIS — G8929 Other chronic pain: Secondary | ICD-10-CM | POA: Diagnosis not present

## 2018-01-09 DIAGNOSIS — Z90710 Acquired absence of both cervix and uterus: Secondary | ICD-10-CM | POA: Diagnosis not present

## 2018-01-09 DIAGNOSIS — F419 Anxiety disorder, unspecified: Secondary | ICD-10-CM | POA: Diagnosis not present

## 2018-01-09 DIAGNOSIS — J440 Chronic obstructive pulmonary disease with acute lower respiratory infection: Secondary | ICD-10-CM | POA: Diagnosis not present

## 2018-01-10 DIAGNOSIS — F419 Anxiety disorder, unspecified: Secondary | ICD-10-CM | POA: Diagnosis not present

## 2018-01-10 DIAGNOSIS — J181 Lobar pneumonia, unspecified organism: Secondary | ICD-10-CM | POA: Diagnosis not present

## 2018-01-10 DIAGNOSIS — Z87891 Personal history of nicotine dependence: Secondary | ICD-10-CM | POA: Diagnosis not present

## 2018-01-10 DIAGNOSIS — M545 Low back pain: Secondary | ICD-10-CM | POA: Diagnosis not present

## 2018-01-10 DIAGNOSIS — M79605 Pain in left leg: Secondary | ICD-10-CM | POA: Diagnosis not present

## 2018-01-10 DIAGNOSIS — Z79891 Long term (current) use of opiate analgesic: Secondary | ICD-10-CM | POA: Diagnosis not present

## 2018-01-10 DIAGNOSIS — I10 Essential (primary) hypertension: Secondary | ICD-10-CM | POA: Diagnosis not present

## 2018-01-10 DIAGNOSIS — G894 Chronic pain syndrome: Secondary | ICD-10-CM | POA: Diagnosis not present

## 2018-01-10 DIAGNOSIS — M542 Cervicalgia: Secondary | ICD-10-CM | POA: Diagnosis not present

## 2018-01-10 DIAGNOSIS — Z90710 Acquired absence of both cervix and uterus: Secondary | ICD-10-CM | POA: Diagnosis not present

## 2018-01-10 DIAGNOSIS — G8929 Other chronic pain: Secondary | ICD-10-CM | POA: Diagnosis not present

## 2018-01-10 DIAGNOSIS — Z8542 Personal history of malignant neoplasm of other parts of uterus: Secondary | ICD-10-CM | POA: Diagnosis not present

## 2018-01-10 DIAGNOSIS — J440 Chronic obstructive pulmonary disease with acute lower respiratory infection: Secondary | ICD-10-CM | POA: Diagnosis not present

## 2018-01-17 DIAGNOSIS — F419 Anxiety disorder, unspecified: Secondary | ICD-10-CM | POA: Diagnosis not present

## 2018-01-17 DIAGNOSIS — J440 Chronic obstructive pulmonary disease with acute lower respiratory infection: Secondary | ICD-10-CM | POA: Diagnosis not present

## 2018-01-17 DIAGNOSIS — I10 Essential (primary) hypertension: Secondary | ICD-10-CM | POA: Diagnosis not present

## 2018-01-17 DIAGNOSIS — G8929 Other chronic pain: Secondary | ICD-10-CM | POA: Diagnosis not present

## 2018-01-17 DIAGNOSIS — Z87891 Personal history of nicotine dependence: Secondary | ICD-10-CM | POA: Diagnosis not present

## 2018-01-17 DIAGNOSIS — Z90710 Acquired absence of both cervix and uterus: Secondary | ICD-10-CM | POA: Diagnosis not present

## 2018-01-17 DIAGNOSIS — Z8542 Personal history of malignant neoplasm of other parts of uterus: Secondary | ICD-10-CM | POA: Diagnosis not present

## 2018-01-17 DIAGNOSIS — J181 Lobar pneumonia, unspecified organism: Secondary | ICD-10-CM | POA: Diagnosis not present

## 2018-02-07 DIAGNOSIS — M545 Low back pain: Secondary | ICD-10-CM | POA: Diagnosis not present

## 2018-02-07 DIAGNOSIS — Z79891 Long term (current) use of opiate analgesic: Secondary | ICD-10-CM | POA: Diagnosis not present

## 2018-02-07 DIAGNOSIS — M542 Cervicalgia: Secondary | ICD-10-CM | POA: Diagnosis not present

## 2018-02-07 DIAGNOSIS — G894 Chronic pain syndrome: Secondary | ICD-10-CM | POA: Diagnosis not present

## 2018-02-07 DIAGNOSIS — M79605 Pain in left leg: Secondary | ICD-10-CM | POA: Diagnosis not present

## 2018-02-20 DIAGNOSIS — M150 Primary generalized (osteo)arthritis: Secondary | ICD-10-CM | POA: Diagnosis not present

## 2018-02-20 DIAGNOSIS — E119 Type 2 diabetes mellitus without complications: Secondary | ICD-10-CM | POA: Diagnosis not present

## 2018-02-20 DIAGNOSIS — I10 Essential (primary) hypertension: Secondary | ICD-10-CM | POA: Diagnosis not present

## 2018-02-20 DIAGNOSIS — J449 Chronic obstructive pulmonary disease, unspecified: Secondary | ICD-10-CM | POA: Diagnosis not present

## 2018-02-20 DIAGNOSIS — R634 Abnormal weight loss: Secondary | ICD-10-CM | POA: Diagnosis not present

## 2018-02-20 DIAGNOSIS — E789 Disorder of lipoprotein metabolism, unspecified: Secondary | ICD-10-CM | POA: Diagnosis not present

## 2018-03-07 DIAGNOSIS — G894 Chronic pain syndrome: Secondary | ICD-10-CM | POA: Diagnosis not present

## 2018-03-07 DIAGNOSIS — Z79891 Long term (current) use of opiate analgesic: Secondary | ICD-10-CM | POA: Diagnosis not present

## 2018-03-07 DIAGNOSIS — M545 Low back pain: Secondary | ICD-10-CM | POA: Diagnosis not present

## 2018-03-07 DIAGNOSIS — M79605 Pain in left leg: Secondary | ICD-10-CM | POA: Diagnosis not present

## 2018-03-07 DIAGNOSIS — M542 Cervicalgia: Secondary | ICD-10-CM | POA: Diagnosis not present

## 2018-03-10 DIAGNOSIS — I10 Essential (primary) hypertension: Secondary | ICD-10-CM | POA: Diagnosis not present

## 2018-04-08 DIAGNOSIS — M79605 Pain in left leg: Secondary | ICD-10-CM | POA: Diagnosis not present

## 2018-04-08 DIAGNOSIS — M542 Cervicalgia: Secondary | ICD-10-CM | POA: Diagnosis not present

## 2018-04-08 DIAGNOSIS — Z79891 Long term (current) use of opiate analgesic: Secondary | ICD-10-CM | POA: Diagnosis not present

## 2018-04-08 DIAGNOSIS — G894 Chronic pain syndrome: Secondary | ICD-10-CM | POA: Diagnosis not present

## 2018-04-08 DIAGNOSIS — M545 Low back pain: Secondary | ICD-10-CM | POA: Diagnosis not present

## 2018-05-07 DIAGNOSIS — M545 Low back pain: Secondary | ICD-10-CM | POA: Diagnosis not present

## 2018-05-07 DIAGNOSIS — G894 Chronic pain syndrome: Secondary | ICD-10-CM | POA: Diagnosis not present

## 2018-05-07 DIAGNOSIS — M79605 Pain in left leg: Secondary | ICD-10-CM | POA: Diagnosis not present

## 2018-05-07 DIAGNOSIS — Z79891 Long term (current) use of opiate analgesic: Secondary | ICD-10-CM | POA: Diagnosis not present

## 2018-05-07 DIAGNOSIS — M542 Cervicalgia: Secondary | ICD-10-CM | POA: Diagnosis not present

## 2018-06-06 DIAGNOSIS — Z79891 Long term (current) use of opiate analgesic: Secondary | ICD-10-CM | POA: Diagnosis not present

## 2018-06-06 DIAGNOSIS — M545 Low back pain: Secondary | ICD-10-CM | POA: Diagnosis not present

## 2018-06-06 DIAGNOSIS — M79605 Pain in left leg: Secondary | ICD-10-CM | POA: Diagnosis not present

## 2018-06-06 DIAGNOSIS — G894 Chronic pain syndrome: Secondary | ICD-10-CM | POA: Diagnosis not present

## 2018-06-06 DIAGNOSIS — M542 Cervicalgia: Secondary | ICD-10-CM | POA: Diagnosis not present

## 2018-07-11 DIAGNOSIS — Z79891 Long term (current) use of opiate analgesic: Secondary | ICD-10-CM | POA: Diagnosis not present

## 2018-07-11 DIAGNOSIS — M542 Cervicalgia: Secondary | ICD-10-CM | POA: Diagnosis not present

## 2018-07-11 DIAGNOSIS — G894 Chronic pain syndrome: Secondary | ICD-10-CM | POA: Diagnosis not present

## 2018-07-11 DIAGNOSIS — M545 Low back pain: Secondary | ICD-10-CM | POA: Diagnosis not present

## 2018-07-11 DIAGNOSIS — M79605 Pain in left leg: Secondary | ICD-10-CM | POA: Diagnosis not present

## 2018-08-08 DIAGNOSIS — M542 Cervicalgia: Secondary | ICD-10-CM | POA: Diagnosis not present

## 2018-08-08 DIAGNOSIS — M79605 Pain in left leg: Secondary | ICD-10-CM | POA: Diagnosis not present

## 2018-08-08 DIAGNOSIS — G894 Chronic pain syndrome: Secondary | ICD-10-CM | POA: Diagnosis not present

## 2018-08-08 DIAGNOSIS — M545 Low back pain: Secondary | ICD-10-CM | POA: Diagnosis not present

## 2018-08-08 DIAGNOSIS — Z79891 Long term (current) use of opiate analgesic: Secondary | ICD-10-CM | POA: Diagnosis not present

## 2018-08-18 DIAGNOSIS — R55 Syncope and collapse: Secondary | ICD-10-CM | POA: Diagnosis not present

## 2018-08-18 DIAGNOSIS — E785 Hyperlipidemia, unspecified: Secondary | ICD-10-CM | POA: Diagnosis not present

## 2018-08-18 DIAGNOSIS — E119 Type 2 diabetes mellitus without complications: Secondary | ICD-10-CM | POA: Diagnosis not present

## 2018-08-18 DIAGNOSIS — I10 Essential (primary) hypertension: Secondary | ICD-10-CM | POA: Diagnosis not present

## 2018-09-04 DIAGNOSIS — E119 Type 2 diabetes mellitus without complications: Secondary | ICD-10-CM | POA: Diagnosis not present

## 2018-09-04 DIAGNOSIS — D72829 Elevated white blood cell count, unspecified: Secondary | ICD-10-CM | POA: Diagnosis not present

## 2018-09-04 DIAGNOSIS — R5383 Other fatigue: Secondary | ICD-10-CM | POA: Diagnosis not present

## 2018-09-04 DIAGNOSIS — R55 Syncope and collapse: Secondary | ICD-10-CM | POA: Diagnosis not present

## 2018-09-04 DIAGNOSIS — I10 Essential (primary) hypertension: Secondary | ICD-10-CM | POA: Diagnosis not present

## 2018-09-04 DIAGNOSIS — R634 Abnormal weight loss: Secondary | ICD-10-CM | POA: Diagnosis not present

## 2018-09-08 DIAGNOSIS — G894 Chronic pain syndrome: Secondary | ICD-10-CM | POA: Diagnosis not present

## 2018-09-08 DIAGNOSIS — Z79891 Long term (current) use of opiate analgesic: Secondary | ICD-10-CM | POA: Diagnosis not present

## 2018-09-08 DIAGNOSIS — M542 Cervicalgia: Secondary | ICD-10-CM | POA: Diagnosis not present

## 2018-09-08 DIAGNOSIS — M79605 Pain in left leg: Secondary | ICD-10-CM | POA: Diagnosis not present

## 2018-09-08 DIAGNOSIS — M545 Low back pain: Secondary | ICD-10-CM | POA: Diagnosis not present

## 2018-09-15 ENCOUNTER — Inpatient Hospital Stay: Payer: Medicare HMO | Admitting: Oncology

## 2018-09-22 ENCOUNTER — Inpatient Hospital Stay: Payer: Medicare HMO | Admitting: Oncology

## 2018-09-26 ENCOUNTER — Inpatient Hospital Stay: Payer: Medicare HMO | Admitting: Oncology

## 2018-10-06 DIAGNOSIS — M545 Low back pain: Secondary | ICD-10-CM | POA: Diagnosis not present

## 2018-10-06 DIAGNOSIS — M79605 Pain in left leg: Secondary | ICD-10-CM | POA: Diagnosis not present

## 2018-10-06 DIAGNOSIS — G894 Chronic pain syndrome: Secondary | ICD-10-CM | POA: Diagnosis not present

## 2018-10-06 DIAGNOSIS — Z79891 Long term (current) use of opiate analgesic: Secondary | ICD-10-CM | POA: Diagnosis not present

## 2018-10-06 DIAGNOSIS — M542 Cervicalgia: Secondary | ICD-10-CM | POA: Diagnosis not present

## 2018-10-07 ENCOUNTER — Other Ambulatory Visit: Payer: Self-pay

## 2018-10-07 ENCOUNTER — Inpatient Hospital Stay: Payer: Medicare HMO | Attending: Oncology | Admitting: Oncology

## 2018-10-07 ENCOUNTER — Encounter (INDEPENDENT_AMBULATORY_CARE_PROVIDER_SITE_OTHER): Payer: Self-pay

## 2018-10-07 ENCOUNTER — Encounter: Payer: Self-pay | Admitting: Oncology

## 2018-10-07 ENCOUNTER — Inpatient Hospital Stay: Payer: Medicare HMO

## 2018-10-07 VITALS — BP 138/65 | HR 84 | Temp 97.4°F | Resp 18 | Wt 114.1 lb

## 2018-10-07 DIAGNOSIS — D72829 Elevated white blood cell count, unspecified: Secondary | ICD-10-CM | POA: Diagnosis not present

## 2018-10-07 DIAGNOSIS — D473 Essential (hemorrhagic) thrombocythemia: Secondary | ICD-10-CM

## 2018-10-07 DIAGNOSIS — Z87891 Personal history of nicotine dependence: Secondary | ICD-10-CM | POA: Diagnosis not present

## 2018-10-07 DIAGNOSIS — Z809 Family history of malignant neoplasm, unspecified: Secondary | ICD-10-CM

## 2018-10-07 DIAGNOSIS — R634 Abnormal weight loss: Secondary | ICD-10-CM | POA: Insufficient documentation

## 2018-10-07 DIAGNOSIS — D75839 Thrombocytosis, unspecified: Secondary | ICD-10-CM

## 2018-10-07 LAB — CBC WITH DIFFERENTIAL/PLATELET
Abs Immature Granulocytes: 0.06 10*3/uL (ref 0.00–0.07)
BASOS ABS: 0.1 10*3/uL (ref 0.0–0.1)
BASOS PCT: 0 %
EOS ABS: 0.3 10*3/uL (ref 0.0–0.5)
Eosinophils Relative: 2 %
HCT: 31.6 % — ABNORMAL LOW (ref 36.0–46.0)
Hemoglobin: 10.4 g/dL — ABNORMAL LOW (ref 12.0–15.0)
IMMATURE GRANULOCYTES: 0 %
Lymphocytes Relative: 11 %
Lymphs Abs: 1.7 10*3/uL (ref 0.7–4.0)
MCH: 26.8 pg (ref 26.0–34.0)
MCHC: 32.9 g/dL (ref 30.0–36.0)
MCV: 81.4 fL (ref 80.0–100.0)
MONOS PCT: 8 %
Monocytes Absolute: 1.2 10*3/uL — ABNORMAL HIGH (ref 0.1–1.0)
NEUTROS PCT: 79 %
Neutro Abs: 12.3 10*3/uL — ABNORMAL HIGH (ref 1.7–7.7)
Platelets: 431 10*3/uL — ABNORMAL HIGH (ref 150–400)
RBC: 3.88 MIL/uL (ref 3.87–5.11)
RDW: 14 % (ref 11.5–15.5)
WBC: 15.6 10*3/uL — AB (ref 4.0–10.5)
nRBC: 0 % (ref 0.0–0.2)

## 2018-10-07 LAB — IRON AND TIBC
IRON: 47 ug/dL (ref 28–170)
Saturation Ratios: 10 % — ABNORMAL LOW (ref 10.4–31.8)
TIBC: 457 ug/dL — AB (ref 250–450)
UIBC: 410 ug/dL

## 2018-10-07 LAB — COMPREHENSIVE METABOLIC PANEL
ALK PHOS: 61 U/L (ref 38–126)
ALT: 23 U/L (ref 0–44)
ANION GAP: 9 (ref 5–15)
AST: 35 U/L (ref 15–41)
Albumin: 4 g/dL (ref 3.5–5.0)
BILIRUBIN TOTAL: 0.9 mg/dL (ref 0.3–1.2)
BUN: 14 mg/dL (ref 8–23)
CALCIUM: 9.1 mg/dL (ref 8.9–10.3)
CO2: 28 mmol/L (ref 22–32)
Chloride: 88 mmol/L — ABNORMAL LOW (ref 98–111)
Creatinine, Ser: 0.91 mg/dL (ref 0.44–1.00)
Glucose, Bld: 107 mg/dL — ABNORMAL HIGH (ref 70–99)
Potassium: 3.3 mmol/L — ABNORMAL LOW (ref 3.5–5.1)
Sodium: 125 mmol/L — ABNORMAL LOW (ref 135–145)
TOTAL PROTEIN: 6.9 g/dL (ref 6.5–8.1)

## 2018-10-07 LAB — FERRITIN: Ferritin: 6 ng/mL — ABNORMAL LOW (ref 11–307)

## 2018-10-07 LAB — TECHNOLOGIST SMEAR REVIEW: Tech Review: INCREASED

## 2018-10-07 NOTE — Progress Notes (Signed)
Hematology/Oncology Consult note [facility] [phone] [phone]   Patient Care Team: Alvester Chou, NP as PCP - General (Nurse Practitioner)  REFERRING PROVIDER: Alvester Chou, NP  CHIEF COMPLAINTS/REASON FOR VISIT:  Evaluation of leukocytosis and weight loss  HISTORY OF PRESENTING ILLNESS:  Kelsey Long is a  72 y.o.  female with PMH listed below who was referred to me for evaluation of leukocytosis and weight loss Reviewed patient' recent labs obtained by PCP.  09/04/2018 CBC showed elevated white count of 14, differential showed neutrophilia with ANC of 8.2, lymphocytosis with absolute lymphocyte count 3.4, monocytosis 1.2, eosinophil 1.0.  Platelet counts 536000 She also had CEA checked which was normal No previous records available to determine the onset of leukocytosis.  No aggravating or elevated factors. Associated symptoms or signs: Positive for weight loss unintentional.  Cannot specify how many weight she has lost. Denies  fever, chills,night sweats.  Feeling fatigue Smoking history: 80-pack-year smoking history, quit smoking 7 years ago.  History of recent oral steroid use or steroid injection: Denies History of recent infection: She had a pneumonia in November 2018  Lives at home with son.  Son accompany patient to today's visit.   Review of Systems  Constitutional: Positive for malaise/fatigue and weight loss. Negative for chills and fever.  HENT: Negative for nosebleeds and sore throat.   Eyes: Negative for double vision, photophobia and redness.  Respiratory: Positive for cough. Negative for shortness of breath and wheezing.   Cardiovascular: Negative for chest pain, palpitations and orthopnea.  Gastrointestinal: Negative for abdominal pain, blood in stool, nausea and vomiting.  Genitourinary: Negative for dysuria.  Musculoskeletal: Negative for back pain, myalgias and neck pain.  Skin: Negative for itching and  rash.  Neurological: Negative for dizziness, tingling and tremors.  Endo/Heme/Allergies: Negative for environmental allergies. Does not bruise/bleed easily.  Psychiatric/Behavioral: Negative for depression.    MEDICAL HISTORY:  Past Medical History:  Diagnosis Date  . Anxiety   . Chronic pain   . COPD (chronic obstructive pulmonary disease) ([place])   . Hypertension   . Uterine cancer [facility])     SURGICAL HISTORY: Past Surgical History:  Procedure Laterality Date  . BILATERAL CARPAL TUNNEL RELEASE    . GALLBLADDER SURGERY    . NECK SURGERY    . TOTAL ABDOMINAL HYSTERECTOMY      SOCIAL HISTORY: Social History   Socioeconomic History  . Marital status: Divorced    Spouse name: Not on file  . Number of children: Not on file  . Years of education: Not on file  . Highest education level: Not on file  Occupational History  . Not on file  Social Needs  . Financial resource strain: Not on file  . Food insecurity:    Worry: Not on file    Inability: Not on file  . Transportation needs:    Medical: Not on file    Non-medical: Not on file  Tobacco Use  . Smoking status: Former Smoker    Packs/day: 1.00    Years: 40.00    Pack years: 40.00    Last attempt to quit: 12/24/2009    Years since quitting: 8.7  . Smokeless tobacco: Never Used  Substance and Sexual Activity  . Alcohol use: Yes    Alcohol/week: 7.0 standard drinks    Types: 7 Cans of beer per week    Comment: 1 beer daily   . Drug use: Yes    Types: Oxycodone  . Sexual activity:  Not on file  Lifestyle  . Physical activity:    Days per week: Not on file    Minutes per session: Not on file  . Stress: Not on file  Relationships  . Social connections:    Talks on phone: Not on file    Gets together: Not on file    Attends religious service: Not on file    Active member of club or organization: Not on file    Attends meetings of clubs or organizations: Not on file    Relationship status: Not on file  .  Intimate partner violence:    Fear of current or ex partner: Not on file    Emotionally abused: Not on file    Physically abused: Not on file    Forced sexual activity: Not on file  Other Topics Concern  . Not on file  Social History Narrative  . Not on file    FAMILY HISTORY: Family History  Problem Relation Age of Onset  . Emphysema Mother   . Cancer Mother   . Heart attack Mother   . Heart failure Father   . Emphysema Maternal Uncle   . Asthma Unknown        daughter  . Heart failure Maternal Grandmother     ALLERGIES:  is allergic to penicillins; shellfish allergy; sulfa drugs cross reactors; tetracyclines & related; and tramadol.  MEDICATIONS:  Current Outpatient Medications  Medication Sig Dispense Refill  . albuterol (PROVENTIL HFA;VENTOLIN HFA) 108 (90 BASE) MCG/ACT inhaler Inhale 2 puffs into the lungs every 6 (six) hours as needed for wheezing or shortness of breath.     Marland Kitchen albuterol (PROVENTIL) (2.5 MG/3ML) 0.083% nebulizer solution Take 3 mLs (2.5 mg total) by nebulization every 6 (six) hours as needed for wheezing or shortness of breath. 75 mL 2  . atorvastatin (LIPITOR) 20 MG tablet Take 20 mg by mouth daily.  5  . benazepril-hydrochlorthiazide (LOTENSIN HCT) 20-12.5 MG per tablet Take 1 tablet by mouth daily.    . budesonide-formoterol (SYMBICORT) 160-4.5 MCG/ACT inhaler Inhale 2 puffs into the lungs 2 (two) times daily.    . Cholecalciferol (VITAMIN D3) 2000 UNITS TABS Take 1 capsule by mouth daily.    . cloNIDine (CATAPRES) 0.1 MG tablet Take 1 tablet (0.1 mg total) by mouth 2 (two) times daily. 60 tablet 11  . DULoxetine (CYMBALTA) 30 MG capsule Take 30 mg by mouth 2 (two) times daily.    Marland Kitchen gabapentin (NEURONTIN) 600 MG tablet Take 600 mg by mouth at bedtime.     . hydrOXYzine (ATARAX/VISTARIL) 25 MG tablet Take 1 tablet (25 mg total) by mouth every 6 (six) hours as needed. 30 tablet 0  . metoprolol succinate (TOPROL-XL) 50 MG 24 hr tablet Take 50 mg by mouth  daily. Take with or immediately following a meal.    . montelukast (SINGULAIR) 10 MG tablet Take 10 mg by mouth at bedtime.     Marland Kitchen MOVANTIK 25 MG TABS tablet Take 1 tablet daily by mouth.    Karma Greaser 4 MG/0.1ML LIQD nasal spray kit 4 mg.  0  . omeprazole (PRILOSEC) 20 MG capsule Take 20 mg by mouth daily.    Marland Kitchen oxybutynin (DITROPAN-XL) 5 MG 24 hr tablet Take 2.5 mg 2 (two) times daily by mouth.    . oxyCODONE (ROXICODONE) 15 MG immediate release tablet Take 1 tablet every 6 (six) hours as needed by mouth.    . potassium chloride (K-DUR) 10 MEQ tablet Take 10 mEq  by mouth daily.    Marland Kitchen tiotropium (SPIRIVA) 18 MCG inhalation capsule Place 18 mcg into inhaler and inhale daily.     No current facility-administered medications for this visit.      PHYSICAL EXAMINATION: ECOG PERFORMANCE STATUS: 1 - Symptomatic but completely ambulatory Vitals:   10/07/18 1020  BP: 138/65  Pulse: 84  Resp: 18  Temp: (!) 97.4 F (36.3 C)   Filed Weights   10/07/18 1020  Weight: 114 lb 1 oz (51.7 kg)    Physical Exam  Constitutional: She is oriented to person, place, and time. No distress.  Thin  HENT:  Head: Normocephalic and atraumatic.  Mouth/Throat: Oropharynx is clear and moist.  Eyes: Pupils are equal, round, and reactive to light. EOM are normal. No scleral icterus.  Neck: Normal range of motion. Neck supple.  Cardiovascular: Normal rate, regular rhythm and normal heart sounds.  Pulmonary/Chest: Effort normal. No respiratory distress. She has no wheezes.  Decreased breath sound bilaterally  Abdominal: Soft. Bowel sounds are normal. She exhibits no distension and no mass. There is no tenderness.  Musculoskeletal: Normal range of motion. She exhibits no edema or deformity.  Neurological: She is alert and oriented to person, place, and time. No cranial nerve deficit. Coordination normal.  Skin: Skin is warm and dry. No rash noted. No erythema.  Psychiatric: She has a normal mood and affect. Her  behavior is normal. Thought content normal.    CMP Latest Ref Rng & Units 11/15/2017  Glucose 65 - 99 mg/dL 106(H)  BUN 6 - 20 mg/dL 11  Creatinine 0.44 - 1.00 mg/dL 0.58  Sodium 135 - 145 mmol/L 128(L)  Potassium 3.5 - 5.1 mmol/L 3.6  Chloride 101 - 111 mmol/L 94(L)  CO2 22 - 32 mmol/L 25  Calcium 8.9 - 10.3 mg/dL 7.9(L)  Total Protein 6.5 - 8.1 g/dL -  Total Bilirubin 0.3 - 1.2 mg/dL -  Alkaline Phos 38 - 126 U/L -  AST 15 - 41 U/L -  ALT 14 - 54 U/L -   CBC Latest Ref Rng & Units 11/14/2017  WBC 3.6 - 11.0 K/uL 13.0(H)  Hemoglobin 12.0 - 16.0 g/dL 10.9(L)  Hematocrit 35.0 - 47.0 % 32.4(L)  Platelets 150 - 440 K/uL 372    LABORATORY DATA:  I have reviewed the data as listed Lab Results  Component Value Date   WBC 13.0 (H) 11/14/2017   HGB 10.9 (L) 11/14/2017   HCT 32.4 (L) 11/14/2017   MCV 85.5 11/14/2017   PLT 372 11/14/2017   Recent Labs    11/14/17 0410 11/14/17 1020 11/15/17 0446  NA 127* 126* 128*  K 3.2* 3.3* 3.6  CL 90* 87* 94*  CO2 25 27 25   GLUCOSE 83 112* 106*  BUN 11 12 11   CREATININE 0.54 0.73 0.58  CALCIUM 8.0* 8.0* 7.9*  GFRNONAA >60 >60 >60  GFRAA >60 >60 >60   Iron/TIBC/Ferritin/ %Sat No results found for: IRON, TIBC, FERRITIN, IRONPCTSAT    RADIOGRAPHIC STUDIES: I have personally reviewed the radiological images as listed and agreed with the findings in the report. 11/11/2017 chest x-ray showed right lower lobe pneumonia.  Small right effusion.  ASSESSMENT & PLAN:  1. Leukocytosis, unspecified type   2. Thrombocytosis ([place])   3. Abnormal weight loss   4. Weight loss    #Previous labs reviewed and discussed with patient.  I also independently reviewed most recent chest x-ray that was done in November 2018, which showed right sided pneumonia and small pleural  effusion.  Other than that she has not had any recent chest images.  Discussed with patient that Leukocytosis, can be secondary to infection, chronic inflammation, smoking,  autoimmune disease, or underlying bone marrow disorders.   For the work up of patient's leukocytosis and thrombocytosis, I recommend checking CBC;CMP,  smear review, flowcytometry, iron panel, Jak 2 etc. Also discussed possible bone marrow biopsy if above workup is inconclusive; however I would prefer not to do a bone marrow unless absolutely needed.  #Weight loss, in the context of extensive smoking history, high risk for lung cancer. Check CT chest without contrast.  Orders Placed This Encounter  Procedures  . CT Chest Wo Contrast    Standing Status:   Future    Standing Expiration Date:   10/07/2019    Order Specific Question:   ** REASON FOR EXAM (FREE TEXT)    Answer:   extensive smoking history, weight loss, cough.    Order Specific Question:   Preferred imaging location?    Answer:   [facility]    Order Specific Question:   Radiology Contrast Protocol - do NOT remove file path    Answer:   [email or web address]  . CBC with Differential/Platelet    Standing Status:   Future    Number of Occurrences:   1    Standing Expiration Date:   10/08/2019  . Comprehensive metabolic panel    Standing Status:   Future    Number of Occurrences:   1    Standing Expiration Date:   10/08/2019  . Technologist smear review    Standing Status:   Future    Number of Occurrences:   1    Standing Expiration Date:   10/08/2019  . Iron and TIBC    Standing Status:   Future    Number of Occurrences:   1    Standing Expiration Date:   10/08/2019  . Ferritin    Standing Status:   Future    Number of Occurrences:   1    Standing Expiration Date:   10/08/2019  . JAK2 V617F, w Reflex to CALR/E12/MPL    Standing Status:   Future    Number of Occurrences:   1    Standing Expiration Date:   10/08/2019    All questions were answered. The patient knows to call the clinic with any problems questions or concerns.  Return of visit: 2 weeks to discuss labs. Thank you for  this kind referral and the opportunity to participate in the care of this patient. A copy of today's note is routed to referring provider  Total face to face encounter time for this patient visit was 45 min. >50% of the time was  spent in counseling and coordination of care.    Earlie Server, MD, PhD Hematology Oncology [facility] Pager- [phone] 10/07/2018

## 2018-10-11 ENCOUNTER — Other Ambulatory Visit: Payer: Self-pay | Admitting: Oncology

## 2018-10-11 DIAGNOSIS — D509 Iron deficiency anemia, unspecified: Secondary | ICD-10-CM | POA: Insufficient documentation

## 2018-10-14 ENCOUNTER — Ambulatory Visit: Payer: Medicare HMO

## 2018-10-17 LAB — CALR + JAK2 E12-15 + MPL (REFLEXED)

## 2018-10-17 LAB — JAK2 V617F, W REFLEX TO CALR/E12/MPL

## 2018-10-21 ENCOUNTER — Inpatient Hospital Stay: Payer: Medicare HMO | Admitting: Oncology

## 2018-10-21 ENCOUNTER — Ambulatory Visit: Payer: Medicare HMO

## 2018-10-21 ENCOUNTER — Other Ambulatory Visit: Payer: Self-pay | Admitting: Oncology

## 2018-10-21 ENCOUNTER — Telehealth: Payer: Self-pay | Admitting: Oncology

## 2018-10-21 ENCOUNTER — Inpatient Hospital Stay: Payer: Medicare HMO

## 2018-10-21 MED ORDER — FERROUS SULFATE 325 (65 FE) MG PO TBEC: 325.0000 mg | DELAYED_RELEASE_TABLET | Freq: Two times a day (BID) | ORAL | 3 refills | Status: DC

## 2018-10-21 NOTE — Telephone Encounter (Signed)
Patient no showed to her appointment and CT appointment. She also is iron deficient. Will send her Rx for ferrous sulfate 325mg  BID. Called patient and she tells that she dose not have transportation, no money to pay for her bills. Message sent to social worker to look into her case.

## 2018-10-22 ENCOUNTER — Telehealth: Payer: Self-pay | Admitting: Oncology

## 2018-10-22 NOTE — Telephone Encounter (Signed)
Rschd MD for 11/05 @ 11:30 AM + **NEW VENOFER**, per Julie/verbal.  Appts schd and conf w patient. Patient aware of  wait time between MD & Infusion appt.

## 2018-10-28 ENCOUNTER — Inpatient Hospital Stay: Payer: Medicare HMO | Attending: Oncology | Admitting: Oncology

## 2018-10-28 ENCOUNTER — Inpatient Hospital Stay: Payer: Medicare HMO

## 2018-10-28 ENCOUNTER — Ambulatory Visit: Payer: Medicare HMO

## 2018-10-28 ENCOUNTER — Encounter: Payer: Self-pay | Admitting: Oncology

## 2018-10-28 ENCOUNTER — Other Ambulatory Visit: Payer: Self-pay

## 2018-10-28 ENCOUNTER — Ambulatory Visit
Admission: RE | Admit: 2018-10-28 | Discharge: 2018-10-28 | Disposition: A | Discharge status: Home / Self Care | Payer: Medicare HMO | Source: Ambulatory Visit | Attending: Oncology | Admitting: Oncology

## 2018-10-28 VITALS — BP 112/61 | HR 89 | Temp 98.6°F | Resp 18 | Wt 112.5 lb

## 2018-10-28 VITALS — BP 138/68 | HR 93 | Resp 20

## 2018-10-28 DIAGNOSIS — D72829 Elevated white blood cell count, unspecified: Secondary | ICD-10-CM

## 2018-10-28 DIAGNOSIS — I700 Atherosclerosis of aorta: Secondary | ICD-10-CM | POA: Diagnosis not present

## 2018-10-28 DIAGNOSIS — R599 Enlarged lymph nodes, unspecified: Secondary | ICD-10-CM | POA: Insufficient documentation

## 2018-10-28 DIAGNOSIS — R911 Solitary pulmonary nodule: Secondary | ICD-10-CM | POA: Insufficient documentation

## 2018-10-28 DIAGNOSIS — D473 Essential (hemorrhagic) thrombocythemia: Secondary | ICD-10-CM

## 2018-10-28 DIAGNOSIS — J984 Other disorders of lung: Secondary | ICD-10-CM | POA: Insufficient documentation

## 2018-10-28 DIAGNOSIS — R918 Other nonspecific abnormal finding of lung field: Secondary | ICD-10-CM | POA: Diagnosis not present

## 2018-10-28 DIAGNOSIS — I2510 Atherosclerotic heart disease of native coronary artery without angina pectoris: Secondary | ICD-10-CM | POA: Insufficient documentation

## 2018-10-28 DIAGNOSIS — Z87891 Personal history of nicotine dependence: Secondary | ICD-10-CM | POA: Diagnosis not present

## 2018-10-28 DIAGNOSIS — D509 Iron deficiency anemia, unspecified: Secondary | ICD-10-CM

## 2018-10-28 DIAGNOSIS — R634 Abnormal weight loss: Secondary | ICD-10-CM

## 2018-10-28 DIAGNOSIS — E871 Hypo-osmolality and hyponatremia: Secondary | ICD-10-CM | POA: Diagnosis not present

## 2018-10-28 DIAGNOSIS — R5383 Other fatigue: Secondary | ICD-10-CM

## 2018-10-28 DIAGNOSIS — D75839 Thrombocytosis, unspecified: Secondary | ICD-10-CM

## 2018-10-28 DIAGNOSIS — J439 Emphysema, unspecified: Secondary | ICD-10-CM | POA: Insufficient documentation

## 2018-10-28 MED ORDER — SODIUM CHLORIDE 0.9 % IV SOLN
Freq: Once | INTRAVENOUS | Status: AC
  Administered 2018-10-28: 14:00:00 via INTRAVENOUS
  Filled 2018-10-28: qty 250

## 2018-10-28 MED ORDER — IRON SUCROSE 20 MG/ML IV SOLN
200.0000 mg | Freq: Once | INTRAVENOUS | Status: AC
  Administered 2018-10-28: 200 mg via INTRAVENOUS
  Filled 2018-10-28: qty 10

## 2018-10-28 NOTE — Progress Notes (Signed)
Patient here for follow up

## 2018-10-30 NOTE — Progress Notes (Signed)
Hematology/Oncology Consult note [facility] [phone] [phone]   Patient Care Team: Alvester Chou, NP as PCP - General (Nurse Practitioner)  REFERRING PROVIDER: Alvester Chou, NP  CHIEF COMPLAINTS/REASON FOR VISIT:  Evaluation of leukocytosis and weight loss  HISTORY OF PRESENTING ILLNESS:  Kelsey Long is a  72 y.o.  female with PMH listed below who was referred to me for evaluation of leukocytosis and weight loss Reviewed patient' recent labs obtained by PCP.  09/04/2018 CBC showed elevated white count of 14, differential showed neutrophilia with ANC of 8.2, lymphocytosis with absolute lymphocyte count 3.4, monocytosis 1.2, eosinophil 1.0.  Platelet counts 536000 She also had CEA checked which was normal No previous records available to determine the onset of leukocytosis.  No aggravating or elevated factors. Associated symptoms or signs: Positive for weight loss unintentional.  Cannot specify how many weight she has lost. Denies  fever, chills,night sweats.  Feeling fatigue Smoking history: 80-pack-year smoking history, quit smoking 7 years ago.  History of recent oral steroid use or steroid injection: Denies History of recent infection: She had a pneumonia in November 2018  Lives at home with son.  Son accompany patient to today's visit.  INTERVAL HISTORY Kelsey Long is a 73 y.o. female who has above history reviewed by me today presents for follow up visit for management of leukocytosis, iron deficiency anemia, and weight loss.  She has no showed for 1 of her follow-up appointment. Patient has had work-up done during interval.  Presents to discuss lab results and further management.  She also had CT of chest done. CT chest showed a sub-solid somewhat reticular nodule measuring 1.3 x 8.8 cm in the right upper lobe. There is also borderline enlarged 1 cm he is short axis AP lymph node which is nonspecific. Aortic  atherosclerosis and emphysema Coronary atherosclerosis.  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 Bibasilar scarring.  Patient continues to feel fatigued.  Denies any shortness of breath, chest pain.  Chronic cough at baseline. Denies hematochezia, hematuria, hematemesis, epistaxis, black tarry stool or easy bruising.      Review of Systems  Constitutional: Positive for malaise/fatigue and weight loss. Negative for chills and fever.  HENT: Negative for nosebleeds and sore throat.   Eyes: Negative for double vision, photophobia and redness.  Respiratory: Positive for cough. Negative for shortness of breath and wheezing.   Cardiovascular: Negative for chest pain, palpitations and orthopnea.  Gastrointestinal: Negative for abdominal pain, blood in stool, nausea and vomiting.  Genitourinary: Negative for dysuria.  Musculoskeletal: Negative for back pain, myalgias and neck pain.  Skin: Negative for itching and rash.  Neurological: Negative for dizziness, tingling and tremors.  Endo/Heme/Allergies: Negative for environmental allergies. Does not bruise/bleed easily.  Psychiatric/Behavioral: Negative for depression.    MEDICAL HISTORY:  Past Medical History:  Diagnosis Date  . Anxiety   . Chronic pain   . COPD (chronic obstructive pulmonary disease) ([place])   . Hypertension   . Uterine cancer [facility])     SURGICAL HISTORY: Past Surgical History:  Procedure Laterality Date  . BILATERAL CARPAL TUNNEL RELEASE    . GALLBLADDER SURGERY    . NECK SURGERY    . TOTAL ABDOMINAL HYSTERECTOMY      SOCIAL HISTORY: Social History   Socioeconomic History  . Marital status: Divorced    Spouse name: Not on file  . Number of children: Not on file  . Years of education: Not on file  . Highest education level: Not on file  Occupational History  . Not on file  Social Needs  . Financial resource strain: Not on file  . Food insecurity:    Worry: Not on file    Inability: Not on file  . Transportation needs:      Medical: Not on file    Non-medical: Not on file  Tobacco Use  . Smoking status: Former Smoker    Packs/day: 1.00    Years: 40.00    Pack years: 40.00    Last attempt to quit: 12/24/2009    Years since quitting: 8.8  . Smokeless tobacco: Never Used  Substance and Sexual Activity  . Alcohol use: Yes    Alcohol/week: 7.0 standard drinks    Types: 7 Cans of beer per week    Comment: 1 beer daily   . Drug use: Yes    Types: Oxycodone  . Sexual activity: Not on file  Lifestyle  . Physical activity:    Days per week: Not on file    Minutes per session: Not on file  . Stress: Not on file  Relationships  . Social connections:    Talks on phone: Not on file    Gets together: Not on file    Attends religious service: Not on file    Active member of club or organization: Not on file    Attends meetings of clubs or organizations: Not on file    Relationship status: Not on file  . Intimate partner violence:    Fear of current or ex partner: Not on file    Emotionally abused: Not on file    Physically abused: Not on file    Forced sexual activity: Not on file  Other Topics Concern  . Not on file  Social History Narrative  . Not on file    FAMILY HISTORY: Family History  Problem Relation Age of Onset  . Emphysema Mother   . Cancer Mother   . Heart attack Mother   . Heart failure Father   . Emphysema Maternal Uncle   . Asthma Unknown        daughter  . Heart failure Maternal Grandmother     ALLERGIES:  is allergic to penicillins; shellfish allergy; sulfa drugs cross reactors; tetracyclines & related; and tramadol.  MEDICATIONS:  Current Outpatient Medications  Medication Sig Dispense Refill  . albuterol (PROVENTIL HFA;VENTOLIN HFA) 108 (90 BASE) MCG/ACT inhaler Inhale 2 puffs into the lungs every 6 (six) hours as needed for wheezing or shortness of breath.     Marland Kitchen albuterol (PROVENTIL) (2.5 MG/3ML) 0.083% nebulizer solution Take 3 mLs (2.5 mg total) by nebulization every  6 (six) hours as needed for wheezing or shortness of breath. 75 mL 2  . atorvastatin (LIPITOR) 20 MG tablet Take 20 mg by mouth daily.  5  . benazepril-hydrochlorthiazide (LOTENSIN HCT) 20-12.5 MG per tablet Take 1 tablet by mouth daily.    . budesonide-formoterol (SYMBICORT) 160-4.5 MCG/ACT inhaler Inhale 2 puffs into the lungs 2 (two) times daily.    . Cholecalciferol (VITAMIN D3) 2000 UNITS TABS Take 1 capsule by mouth daily.    . cloNIDine (CATAPRES) 0.1 MG tablet Take 1 tablet (0.1 mg total) by mouth 2 (two) times daily. 60 tablet 11  . DULoxetine (CYMBALTA) 30 MG capsule Take 30 mg by mouth 2 (two) times daily.    . ferrous sulfate 325 (65 FE) MG EC tablet Take 1 tablet (325 mg total) by mouth 2 (two) times daily with a meal. 60 tablet 3  .  gabapentin (NEURONTIN) 600 MG tablet Take 600 mg by mouth at bedtime.     . hydrOXYzine (ATARAX/VISTARIL) 25 MG tablet Take 1 tablet (25 mg total) by mouth every 6 (six) hours as needed. 30 tablet 0  . metoprolol succinate (TOPROL-XL) 50 MG 24 hr tablet Take 50 mg by mouth daily. Take with or immediately following a meal.    . montelukast (SINGULAIR) 10 MG tablet Take 10 mg by mouth at bedtime.     Marland Kitchen MOVANTIK 25 MG TABS tablet Take 1 tablet daily by mouth.    Karma Greaser 4 MG/0.1ML LIQD nasal spray kit 4 mg.  0  . omeprazole (PRILOSEC) 20 MG capsule Take 20 mg by mouth daily.    Marland Kitchen oxybutynin (DITROPAN-XL) 5 MG 24 hr tablet Take 2.5 mg 2 (two) times daily by mouth.    . oxyCODONE (ROXICODONE) 15 MG immediate release tablet Take 1 tablet every 6 (six) hours as needed by mouth.    . potassium chloride (K-DUR) 10 MEQ tablet Take 10 mEq by mouth daily.    Marland Kitchen tiotropium (SPIRIVA) 18 MCG inhalation capsule Place 18 mcg into inhaler and inhale daily.     No current facility-administered medications for this visit.      PHYSICAL EXAMINATION: ECOG PERFORMANCE STATUS: 1 - Symptomatic but completely ambulatory Vitals:   10/28/18 1137  BP: 112/61  Pulse: 89   Resp: 18  Temp: 98.6 F (37 C)   Filed Weights   10/28/18 1137  Weight: 112 lb 8 oz (51 kg)    Physical Exam  Constitutional: She is oriented to person, place, and time. No distress.  Thin  HENT:  Head: Normocephalic and atraumatic.  Mouth/Throat: Oropharynx is clear and moist.  Eyes: Pupils are equal, round, and reactive to light. EOM are normal. No scleral icterus.  Neck: Normal range of motion. Neck supple.  Cardiovascular: Normal rate, regular rhythm and normal heart sounds.  Pulmonary/Chest: Effort normal. No respiratory distress. She has no wheezes.  Decreased breath sound bilaterally  Abdominal: Soft. Bowel sounds are normal. She exhibits no distension and no mass. There is no tenderness.  Musculoskeletal: Normal range of motion. She exhibits no edema or deformity.  Neurological: She is alert and oriented to person, place, and time. No cranial nerve deficit. Coordination normal.  Skin: Skin is warm and dry. No rash noted. No erythema.  Psychiatric: She has a normal mood and affect. Her behavior is normal. Thought content normal.    CMP Latest Ref Rng & Units 10/07/2018  Glucose 70 - 99 mg/dL 107(H)  BUN 8 - 23 mg/dL 14  Creatinine 0.44 - 1.00 mg/dL 0.91  Sodium 135 - 145 mmol/L 125(L)  Potassium 3.5 - 5.1 mmol/L 3.3(L)  Chloride 98 - 111 mmol/L 88(L)  CO2 22 - 32 mmol/L 28  Calcium 8.9 - 10.3 mg/dL 9.1  Total Protein 6.5 - 8.1 g/dL 6.9  Total Bilirubin 0.3 - 1.2 mg/dL 0.9  Alkaline Phos 38 - 126 U/L 61  AST 15 - 41 U/L 35  ALT 0 - 44 U/L 23   CBC Latest Ref Rng & Units 10/07/2018  WBC 4.0 - 10.5 K/uL 15.6(H)  Hemoglobin 12.0 - 15.0 g/dL 10.4(L)  Hematocrit 36.0 - 46.0 % 31.6(L)  Platelets 150 - 400 K/uL 431(H)    LABORATORY DATA:  I have reviewed the data as listed Lab Results  Component Value Date   WBC 15.6 (H) 10/07/2018   HGB 10.4 (L) 10/07/2018   HCT 31.6 (L) 10/07/2018  MCV 81.4 10/07/2018   PLT 431 (H) 10/07/2018   Recent Labs     11/14/17 1020 11/15/17 0446 10/07/18 1108  NA 126* 128* 125*  K 3.3* 3.6 3.3*  CL 87* 94* 88*  CO2 27 25 28   GLUCOSE 112* 106* 107*  BUN 12 11 14   CREATININE 0.73 0.58 0.91  CALCIUM 8.0* 7.9* 9.1  GFRNONAA >60 >60 >60  GFRAA >60 >60 >60  PROT  --   --  6.9  ALBUMIN  --   --  4.0  AST  --   --  35  ALT  --   --  23  ALKPHOS  --   --  61  BILITOT  --   --  0.9   Iron/TIBC/Ferritin/ %Sat    Component Value Date/Time   IRON 47 10/07/2018 1108   TIBC 457 (H) 10/07/2018 1108   FERRITIN 6 (L) 10/07/2018 1108   IRONPCTSAT 10 (L) 10/07/2018 1108      RADIOGRAPHIC STUDIES: I have personally reviewed the radiological images as listed and agreed with the findings in the report. 11/11/2017 chest x-ray showed right lower lobe pneumonia.  Small right effusion. 10/28/2018 CT chest wo IMPRESSION: 1. Sub solid somewhat reticular nodule measuring 1.3 by 0.8 cm in the right upper lobe. Initial follow-up with CT at 6-12 months is recommended to confirm persistence. If persistent, repeat CT is recommended every 2 years until 5 years of stability has been established. This recommendation follows the consensus statement: Guidelines for Management of Incidental Pulmonary Nodules Detected on CT Images: From the Fleischner Society 2017; Radiology 2017; 284:228-243. 2. There a borderline enlarged 1.0 cm in short axis AP window lymph node which is nonspecific. 3. Aortic Atherosclerosis (ICD10-I70.0) and Emphysema (ICD10-J43.9). 4. Coronary atherosclerosis. 5. Old granulomatous disease.6. Bibasilar scarring.  ASSESSMENT & PLAN:  1. Iron deficiency anemia, unspecified iron deficiency anemia type   2. Leukocytosis, unspecified type   3. Thrombocytosis ([place])   4. Abnormal weight loss   5. Hyponatremia syndrome    #Labs reviewed and discussed with patient. Iron panel showed severe iron deficiency anemia. No active bleeding history. Plan IV iron with Venofer 227m weekly x 4 doses. Allergy  reactions/infusion reaction including anaphylactic reaction discussed with patient. Other side effects include but not limited to high blood pressure, skin rash, weight gain, leg swelling, etc. Patient voices understanding and willing to proceed. She will get first dose today  Never had colonoscopy. Refer to GI  #Hyponatremia, reviewed recent labs and previous lab records.  Hypo-natremia started in in 2016.  Gradually declining Patient drinks alcohol daily.  Hyponatremia ?secondary to alcoholism vs underlying malignancy.  #Leukocytosis and thrombocytosis : Likely reactive. 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  # lung nodule.CT scan was independently reviewed and discussed with patient.   Plan repeat CT in 6 months. Will present on tumor board.  # Weight loss: check TSH    Orders Placed This Encounter  Procedures  . CBC with Differential/Platelet    Standing Status:   Future    Standing Expiration Date:   10/29/2019  . Ferritin    Standing Status:   Future    Standing Expiration Date:   10/29/2019  . Iron and TIBC    Standing Status:   Future    Standing Expiration Date:   10/29/2019    All questions were answered. The patient knows to call the clinic with any problems questions or concerns.  Return of visit: 10 weeks.     ZTalbert Cage  Tasia Catchings, MD, PhD Hematology Oncology [facility] Pager- [phone] 10/30/2018

## 2018-11-03 DIAGNOSIS — M542 Cervicalgia: Secondary | ICD-10-CM | POA: Diagnosis not present

## 2018-11-03 DIAGNOSIS — M79605 Pain in left leg: Secondary | ICD-10-CM | POA: Diagnosis not present

## 2018-11-03 DIAGNOSIS — M545 Low back pain: Secondary | ICD-10-CM | POA: Diagnosis not present

## 2018-11-03 DIAGNOSIS — G894 Chronic pain syndrome: Secondary | ICD-10-CM | POA: Diagnosis not present

## 2018-11-03 DIAGNOSIS — Z79891 Long term (current) use of opiate analgesic: Secondary | ICD-10-CM | POA: Diagnosis not present

## 2018-11-04 ENCOUNTER — Other Ambulatory Visit: Payer: Self-pay | Admitting: Nurse Practitioner

## 2018-11-04 ENCOUNTER — Inpatient Hospital Stay: Payer: Medicare HMO

## 2018-11-04 ENCOUNTER — Other Ambulatory Visit: Payer: Self-pay

## 2018-11-04 ENCOUNTER — Emergency Department: Payer: Medicare HMO

## 2018-11-04 ENCOUNTER — Emergency Department
Admission: EM | Admit: 2018-11-04 | Discharge: 2018-11-04 | Disposition: A | Discharge status: Home / Self Care | Payer: Medicare HMO | Attending: Emergency Medicine | Admitting: Emergency Medicine

## 2018-11-04 DIAGNOSIS — R0781 Pleurodynia: Secondary | ICD-10-CM | POA: Diagnosis not present

## 2018-11-04 DIAGNOSIS — R51 Headache: Secondary | ICD-10-CM | POA: Diagnosis not present

## 2018-11-04 DIAGNOSIS — S20221A Contusion of right back wall of thorax, initial encounter: Secondary | ICD-10-CM | POA: Diagnosis not present

## 2018-11-04 DIAGNOSIS — Y929 Unspecified place or not applicable: Secondary | ICD-10-CM | POA: Diagnosis not present

## 2018-11-04 DIAGNOSIS — Y9301 Activity, walking, marching and hiking: Secondary | ICD-10-CM | POA: Diagnosis not present

## 2018-11-04 DIAGNOSIS — I10 Essential (primary) hypertension: Secondary | ICD-10-CM | POA: Diagnosis not present

## 2018-11-04 DIAGNOSIS — S299XXA Unspecified injury of thorax, initial encounter: Secondary | ICD-10-CM | POA: Diagnosis not present

## 2018-11-04 DIAGNOSIS — S199XXA Unspecified injury of neck, initial encounter: Secondary | ICD-10-CM | POA: Diagnosis not present

## 2018-11-04 DIAGNOSIS — J449 Chronic obstructive pulmonary disease, unspecified: Secondary | ICD-10-CM | POA: Insufficient documentation

## 2018-11-04 DIAGNOSIS — Z87891 Personal history of nicotine dependence: Secondary | ICD-10-CM | POA: Insufficient documentation

## 2018-11-04 DIAGNOSIS — Y999 Unspecified external cause status: Secondary | ICD-10-CM | POA: Diagnosis not present

## 2018-11-04 DIAGNOSIS — Z79899 Other long term (current) drug therapy: Secondary | ICD-10-CM | POA: Insufficient documentation

## 2018-11-04 DIAGNOSIS — R0789 Other chest pain: Secondary | ICD-10-CM | POA: Diagnosis not present

## 2018-11-04 DIAGNOSIS — W010XXA Fall on same level from slipping, tripping and stumbling without subsequent striking against object, initial encounter: Secondary | ICD-10-CM | POA: Diagnosis not present

## 2018-11-04 DIAGNOSIS — W19XXXA Unspecified fall, initial encounter: Secondary | ICD-10-CM

## 2018-11-04 DIAGNOSIS — S0990XA Unspecified injury of head, initial encounter: Secondary | ICD-10-CM | POA: Diagnosis not present

## 2018-11-04 LAB — BASIC METABOLIC PANEL
ANION GAP: 11 (ref 5–15)
BUN: 14 mg/dL (ref 8–23)
CHLORIDE: 94 mmol/L — AB (ref 98–111)
CO2: 29 mmol/L (ref 22–32)
Calcium: 9.3 mg/dL (ref 8.9–10.3)
Creatinine, Ser: 0.73 mg/dL (ref 0.44–1.00)
GFR calc Af Amer: >60 mL/min (ref >60)
GFR calc non Af Amer: >60 mL/min (ref >60)
GLUCOSE: 78 mg/dL (ref 70–99)
POTASSIUM: 3.1 mmol/L — AB (ref 3.5–5.1)
Sodium: 134 mmol/L — ABNORMAL LOW (ref 135–145)

## 2018-11-04 LAB — CBC
HEMATOCRIT: 32.3 % — AB (ref 36.0–46.0)
Hemoglobin: 10.6 g/dL — ABNORMAL LOW (ref 12.0–15.0)
MCH: 27.4 pg (ref 26.0–34.0)
MCHC: 32.8 g/dL (ref 30.0–36.0)
MCV: 83.5 fL (ref 80.0–100.0)
NRBC: 0 % (ref 0.0–0.2)
PLATELETS: 500 10*3/uL — AB (ref 150–400)
RBC: 3.87 MIL/uL (ref 3.87–5.11)
RDW: 15.6 % — ABNORMAL HIGH (ref 11.5–15.5)
WBC: 16 10*3/uL — ABNORMAL HIGH (ref 4.0–10.5)

## 2018-11-04 NOTE — ED Triage Notes (Signed)
Pt states she was brought over after infusion at [facility]. States fell during the night. Was getting up to use restroom. States she had forgotten to take meds and went to take them and fell from standing position. She states "i've been known to fall asleep on my feet." states usually when she falls it's because she has fallen asleep. Hit shoulder blades and posterior head.   A&O, in wheelchair. Speaking in complete sentences. Denies blood thinner use. Usually uses a cane.

## 2018-11-04 NOTE — Progress Notes (Signed)
Patient states, "I didn't take my pills before going to bed last night and remembered during the night. I got up to take me pills and fell and hit my head. It was like I fell asleep standing up. I have been feeling dizzy at times ever since. The back of my head and neck have been hurting since I fell. My son said my eyes were dilated last night. He would put his hands over my eyes and then take them away and my eyes would dilate." Patient staggering when walking in clinic today. Bilateral eyes not dilated at this time. VSS. NP, Beckey Rutter, notified and coming to evaluate patient. NP notified MD, Dr. Tasia Catchings, as well.  Per NP, Beckey Rutter, and MD, Dr. Tasia Catchings, order: hold/do not give Venofer treatment today. Transport patient to Emergency Department for further evaluation.

## 2018-11-04 NOTE — Progress Notes (Signed)
Called to infusion based on nursing reported concerns of patient fall at home last night. Patient was scheduled for infusion of Venofer today. Per patient, she woke in the night and had unexplained fall hitting her upper back/neck and head on wood floor. Denies mechanical fall and says she felt like she 'fell asleep while walking. I guess that's passing out'. Family reported dilated, unequal pupils and abnormal eye movements. She has impaired gait today which she says is worse than usual. She does not take blood thinners. Discussed with Dr. Tasia Catchings who recommends holding venofer today and transfer to ER for imaging and evaluation. Patient to return to [facility] for consideration of Venofer next week as scheduled.

## 2018-11-04 NOTE — ED Provider Notes (Signed)
[facility] Emergency Department Provider Note  Time seen: 5:15 PM  I have reviewed the triage vital signs and the nursing notes.   HISTORY  Chief Complaint Fall    HPI Kelsey Long is a 72 y.o. female with a past medical history of anxiety, chronic pain, COPD, hypertension, presents to the emergency department after fall.  According to the patient last night she got up in the middle the night to use the bathroom, states she fell backwards hitting her right posterior chest and head on the ground.  Denies LOC.  Patient states a frequent history of falls which always occur at night when getting up to use the bathroom.  Patient continued to have pain in the back of her head and right posterior chest so she came to the emergency department today for evaluation.   Past Medical History:  Diagnosis Date  . Anxiety   . Chronic pain   . COPD (chronic obstructive pulmonary disease) ([place])   . Hypertension   . Uterine cancer [facility])     Patient Active Problem List   Diagnosis Date Noted  . Iron deficiency anemia 10/11/2018  . CAP (community acquired pneumonia) 11/11/2017  . DOE (dyspnea on exertion) 09/24/2012    Past Surgical History:  Procedure Laterality Date  . BILATERAL CARPAL TUNNEL RELEASE    . GALLBLADDER SURGERY    . NECK SURGERY    . TOTAL ABDOMINAL HYSTERECTOMY      Prior to Admission medications   Medication Sig Start Date End Date Taking? Authorizing Provider  albuterol (PROVENTIL HFA;VENTOLIN HFA) 108 (90 BASE) MCG/ACT inhaler Inhale 2 puffs into the lungs every 6 (six) hours as needed for wheezing or shortness of breath.     [provider]  albuterol (PROVENTIL) (2.5 MG/3ML) 0.083% nebulizer solution Take 3 mLs (2.5 mg total) by nebulization every 6 (six) hours as needed for wheezing or shortness of breath. 05/02/15   Ruffian, III Luanna Cole, PA-C  atorvastatin (LIPITOR) 20 MG tablet Take 20 mg by mouth daily. 08/13/18   [provider]  benazepril-hydrochlorthiazide (LOTENSIN HCT) 20-12.5 MG per tablet Take 1 tablet by mouth daily.    [provider]  budesonide-formoterol (SYMBICORT) 160-4.5 MCG/ACT inhaler Inhale 2 puffs into the lungs 2 (two) times daily.    [provider]  Cholecalciferol (VITAMIN D3) 2000 UNITS TABS Take 1 capsule by mouth daily.    [provider]  cloNIDine (CATAPRES) 0.1 MG tablet Take 1 tablet (0.1 mg total) by mouth 2 (two) times daily. 11/15/17   Bettey Costa, MD  DULoxetine (CYMBALTA) 30 MG capsule Take 30 mg by mouth 2 (two) times daily.    [provider]  ferrous sulfate 325 (65 FE) MG EC tablet Take 1 tablet (325 mg total) by mouth 2 (two) times daily with a meal. 10/21/18   Earlie Server, MD  gabapentin (NEURONTIN) 600 MG tablet Take 600 mg by mouth at bedtime.     [provider]  hydrOXYzine (ATARAX/VISTARIL) 25 MG tablet Take 1 tablet (25 mg total) by mouth every 6 (six) hours as needed. 12/28/15   Triplett, Johnette Abraham B, FNP  metoprolol succinate (TOPROL-XL) 50 MG 24 hr tablet Take 50 mg by mouth daily. Take with or immediately following a meal.    [provider]  montelukast (SINGULAIR) 10 MG tablet Take 10 mg by mouth at bedtime.  09/19/12   [provider]  MOVANTIK 25 MG TABS tablet Take 1 tablet daily by mouth.  10/16/17   [provider]  NARCAN 4 MG/0.1ML LIQD nasal spray kit 4 mg. 07/11/18   [provider]  omeprazole (PRILOSEC) 20 MG capsule Take 20 mg by mouth daily.    [provider]  oxybutynin (DITROPAN-XL) 5 MG 24 hr tablet Take 2.5 mg 2 (two) times daily by mouth.    [provider]  oxyCODONE (ROXICODONE) 15 MG immediate release tablet Take 1 tablet every 6 (six) hours as needed by mouth. 11/06/17   [provider]  potassium chloride (K-DUR) 10 MEQ tablet Take 10 mEq by mouth daily.    [provider]  tiotropium (SPIRIVA) 18 MCG inhalation capsule Place 18  mcg into inhaler and inhale daily.    [provider]    Allergies  Allergen Reactions  . Penicillins Swelling    Has patient had a PCN reaction causing immediate rash, facial/tongue/throat swelling, SOB or lightheadedness with hypotension: No Has patient had a PCN reaction causing severe rash involving mucus membranes or skin necrosis: No Has patient had a PCN reaction that required hospitalization: No Has patient had a PCN reaction occurring within the last 10 years: No If all of the above answers are "NO", then may proceed with Cephalosporin use.   . Shellfish Allergy   . Sulfa Drugs Cross Reactors Swelling  . Tetracyclines & Related Swelling  . Tramadol Rash    Family History  Problem Relation Age of Onset  . Emphysema Mother   . Cancer Mother   . Heart attack Mother   . Heart failure Father   . Emphysema Maternal Uncle   . Asthma Unknown        daughter  . Heart failure Maternal Grandmother     Social History Social History   Tobacco Use  . Smoking status: Former Smoker    Packs/day: 1.00    Years: 40.00    Pack years: 40.00    Last attempt to quit: 12/24/2009    Years since quitting: 8.8  . Smokeless tobacco: Never Used  Substance Use Topics  . Alcohol use: Yes    Alcohol/week: 7.0 standard drinks    Types: 7 Cans of beer per week    Comment: 1 beer daily   . Drug use: Yes    Types: Oxycodone    Review of Systems Constitutional: Negative for fever.  Negative for LOC per patient. Cardiovascular: Negative for chest pain. Respiratory: Negative for shortness of breath. Gastrointestinal: Negative for abdominal pain, vomiting Musculoskeletal: Posterior head pain, posterior right chest pain Skin: Negative for skin complaints  Neurological: No headache.  No focal weakness or numbness. All other ROS negative  ____________________________________________   PHYSICAL EXAM:  VITAL SIGNS: ED Triage Vitals  Enc Vitals Group     BP 11/04/18 1435 (!)  169/84     Pulse Rate 11/04/18 1435 83     Resp 11/04/18 1435 18     Temp 11/04/18 1435 97.9 F (36.6 C)     Temp Source 11/04/18 1435 Oral     SpO2 11/04/18 1435 98 %     Weight 11/04/18 1437 112 lb (50.8 kg)     Height 11/04/18 1437 _0  (1.575 m)     Head Circumference --      Peak Flow --      Pain Score 11/04/18 1437 6     Pain Loc --      Pain Edu? --      Excl. in [place]? --    Constitutional: Alert  and oriented. Well appearing and in no distress. Eyes: Normal exam ENT   Head: Normocephalic and atraumatic.   Mouth/Throat: Mucous membranes are moist. Cardiovascular: Normal rate, regular rhythm. No murmur Respiratory: Normal respiratory effort without tachypnea nor retractions. Breath sounds are clear Gastrointestinal: Soft and nontender. No distention. Musculoskeletal: Mild to moderate tenderness along the right posterior chest between the spine and scapula.  No C-spine tenderness.  No L-spine tenderness.  Good range of motion in all extremities. Neurologic:  Normal speech and language. No gross focal neurologic deficits  Skin:  Skin is warm, dry and intact.  Psychiatric: Mood and affect are normal. Speech and behavior are normal.   ____________________________________________   RADIOLOGY  CT head and C-spine is negative. Chest x-ray/rib films are negative for acute fracture.  ____________________________________________   INITIAL IMPRESSION / ASSESSMENT AND PLAN / ED COURSE  Pertinent labs & imaging results that were available during my care of the patient were reviewed by me and considered in my medical decision making (see chart for details).  Patient presents emergency department after a fall early this morning/last night.  We will obtain CT imaging of the head and the neck as a precaution, as well as right rib films given posterior right chest pain.  Imaging is resulted negative.  Lab work is reassuring.  Patient has chronic pain in her lower back, states  she has pain medication to take at home she just wanted to be safe.  With a negative work-up I believe the patient is safe for discharge home.  Patient will follow-up with her doctor.  ____________________________________________   FINAL CLINICAL IMPRESSION(S) / ED DIAGNOSES  Fall Contusions Muscular skeletal pain    Harvest Dark, MD 11/04/18 1718

## 2018-11-04 NOTE — ED Notes (Signed)
Spoke to Dr. Alfred Levins about pt presentation, orders received.

## 2018-11-04 NOTE — ED Triage Notes (Signed)
Brought from cancer center in [facility].  Fell during night.  Got up because she forgot to take nighttime meds.  She fell hitting back of head down to shoulders.  Still has pain.  Staff from cancer center states her son had said her pupils were dilated this am, but they appear normal now.

## 2018-11-05 ENCOUNTER — Encounter: Payer: Self-pay | Admitting: Genetics

## 2018-11-06 ENCOUNTER — Other Ambulatory Visit: Payer: Medicare HMO

## 2018-11-06 NOTE — Progress Notes (Signed)
Tumor Board Documentation  Kelsey Long was presented by Dr Rogue Bussing at our Tumor Board on 11/06/2018, which included representatives from medical oncology, radiation oncology, internal medicine, navigation, pathology, radiology, surgical, pharmacy, research, palliative care, pulmonology.  Kelsey Long currently presents as a current patient with history of the following treatments: none.  Additionally, we reviewed previous medical and familial history, history of present illness, and recent lab results along with all available histopathologic and imaging studies. The tumor board considered available treatment options and made the following recommendations: Active surveillance Repeat CT in 6 months  The following procedures/referrals were also placed: No orders of the defined types were placed in this encounter.   Clinical Trial Status: not discussed   Staging used:    National site-specific guidelines   were discussed with respect to the case.  Tumor board is a meeting of clinicians from various specialty areas who evaluate and discuss patients for whom a multidisciplinary approach is being considered. Final determinations in the plan of care are those of the provider(s). The responsibility for follow up of recommendations given during tumor board is that of the provider.   Today's extended care, comprehensive team conference, Kelsey Long was not present for the discussion and was not examined.   Multidisciplinary Tumor Board is a multidisciplinary case peer review process.  Decisions discussed in the Multidisciplinary Tumor Board reflect the opinions of the specialists present at the conference without having examined the patient.  Ultimately, treatment and diagnostic decisions rest with the primary provider(s) and the patient.

## 2018-11-11 ENCOUNTER — Inpatient Hospital Stay: Payer: Medicare HMO

## 2018-11-11 VITALS — BP 143/77 | HR 77 | Temp 96.9°F | Resp 18

## 2018-11-11 DIAGNOSIS — Z87891 Personal history of nicotine dependence: Secondary | ICD-10-CM | POA: Diagnosis not present

## 2018-11-11 DIAGNOSIS — D72829 Elevated white blood cell count, unspecified: Secondary | ICD-10-CM | POA: Diagnosis not present

## 2018-11-11 DIAGNOSIS — D509 Iron deficiency anemia, unspecified: Secondary | ICD-10-CM

## 2018-11-11 DIAGNOSIS — R911 Solitary pulmonary nodule: Secondary | ICD-10-CM | POA: Diagnosis not present

## 2018-11-11 DIAGNOSIS — E871 Hypo-osmolality and hyponatremia: Secondary | ICD-10-CM | POA: Diagnosis not present

## 2018-11-11 MED ORDER — IRON SUCROSE 20 MG/ML IV SOLN
200.0000 mg | Freq: Once | INTRAVENOUS | Status: AC
  Administered 2018-11-11: 200 mg via INTRAVENOUS
  Filled 2018-11-11: qty 10

## 2018-11-11 MED ORDER — SODIUM CHLORIDE 0.9 % IV SOLN
Freq: Once | INTRAVENOUS | Status: AC
  Administered 2018-11-11: 14:00:00 via INTRAVENOUS
  Filled 2018-11-11: qty 250

## 2018-11-18 ENCOUNTER — Inpatient Hospital Stay: Payer: Medicare HMO

## 2018-11-25 ENCOUNTER — Inpatient Hospital Stay: Payer: Medicare HMO | Attending: Oncology

## 2018-11-25 VITALS — BP 130/68 | HR 91 | Temp 99.0°F | Resp 18

## 2018-11-25 DIAGNOSIS — D509 Iron deficiency anemia, unspecified: Secondary | ICD-10-CM | POA: Insufficient documentation

## 2018-11-25 MED ORDER — IRON SUCROSE 20 MG/ML IV SOLN
200.0000 mg | Freq: Once | INTRAVENOUS | Status: AC
  Administered 2018-11-25: 200 mg via INTRAVENOUS
  Filled 2018-11-25: qty 10

## 2018-11-25 MED ORDER — SODIUM CHLORIDE 0.9 % IV SOLN
Freq: Once | INTRAVENOUS | Status: AC
  Administered 2018-11-25: 15:00:00 via INTRAVENOUS
  Filled 2018-11-25: qty 250

## 2018-12-01 DIAGNOSIS — M542 Cervicalgia: Secondary | ICD-10-CM | POA: Diagnosis not present

## 2018-12-01 DIAGNOSIS — Z79891 Long term (current) use of opiate analgesic: Secondary | ICD-10-CM | POA: Diagnosis not present

## 2018-12-01 DIAGNOSIS — M79605 Pain in left leg: Secondary | ICD-10-CM | POA: Diagnosis not present

## 2018-12-01 DIAGNOSIS — M545 Low back pain: Secondary | ICD-10-CM | POA: Diagnosis not present

## 2018-12-01 DIAGNOSIS — G894 Chronic pain syndrome: Secondary | ICD-10-CM | POA: Diagnosis not present

## 2018-12-09 ENCOUNTER — Inpatient Hospital Stay: Payer: Medicare HMO

## 2018-12-09 VITALS — BP 128/72 | HR 93 | Temp 96.2°F | Resp 20

## 2018-12-09 DIAGNOSIS — D509 Iron deficiency anemia, unspecified: Secondary | ICD-10-CM | POA: Diagnosis not present

## 2018-12-09 MED ORDER — SODIUM CHLORIDE 0.9 % IV SOLN
Freq: Once | INTRAVENOUS | Status: AC
  Administered 2018-12-09: 14:00:00 via INTRAVENOUS
  Filled 2018-12-09: qty 250

## 2018-12-09 MED ORDER — IRON SUCROSE 20 MG/ML IV SOLN
200.0000 mg | Freq: Once | INTRAVENOUS | Status: AC
  Administered 2018-12-09: 200 mg via INTRAVENOUS
  Filled 2018-12-09: qty 10

## 2018-12-30 ENCOUNTER — Inpatient Hospital Stay: Payer: Medicare Other | Attending: Oncology

## 2018-12-30 ENCOUNTER — Encounter: Payer: Self-pay | Admitting: *Deleted

## 2018-12-30 DIAGNOSIS — D509 Iron deficiency anemia, unspecified: Secondary | ICD-10-CM | POA: Diagnosis not present

## 2018-12-30 LAB — IRON AND TIBC
IRON: 61 ug/dL (ref 28–170)
Saturation Ratios: 20 % (ref 10.4–31.8)
TIBC: 309 ug/dL (ref 250–450)
UIBC: 248 ug/dL

## 2018-12-30 LAB — CBC WITH DIFFERENTIAL/PLATELET
Abs Immature Granulocytes: 0.07 10*3/uL (ref 0.00–0.07)
BASOS PCT: 1 %
Basophils Absolute: 0.1 10*3/uL (ref 0.0–0.1)
EOS PCT: 7 %
Eosinophils Absolute: 1.1 10*3/uL — ABNORMAL HIGH (ref 0.0–0.5)
HCT: 35.6 % — ABNORMAL LOW (ref 36.0–46.0)
Hemoglobin: 11.8 g/dL — ABNORMAL LOW (ref 12.0–15.0)
Immature Granulocytes: 0 %
Lymphocytes Relative: 20 %
Lymphs Abs: 3.2 10*3/uL (ref 0.7–4.0)
MCH: 28.5 pg (ref 26.0–34.0)
MCHC: 33.1 g/dL (ref 30.0–36.0)
MCV: 86 fL (ref 80.0–100.0)
MONO ABS: 1.2 10*3/uL — AB (ref 0.1–1.0)
Monocytes Relative: 8 %
Neutro Abs: 10.3 10*3/uL — ABNORMAL HIGH (ref 1.7–7.7)
Neutrophils Relative %: 64 %
PLATELETS: 410 10*3/uL — AB (ref 150–400)
RBC: 4.14 MIL/uL (ref 3.87–5.11)
RDW: 17.5 % — ABNORMAL HIGH (ref 11.5–15.5)
WBC: 16 10*3/uL — AB (ref 4.0–10.5)
nRBC: 0 % (ref 0.0–0.2)

## 2018-12-30 LAB — FERRITIN: FERRITIN: 76 ng/mL (ref 11–307)

## 2019-01-03 ENCOUNTER — Other Ambulatory Visit: Payer: Self-pay | Admitting: Oncology

## 2019-01-05 ENCOUNTER — Other Ambulatory Visit: Payer: Self-pay | Admitting: Oncology

## 2019-01-06 ENCOUNTER — Encounter: Payer: Self-pay | Admitting: *Deleted

## 2019-01-06 ENCOUNTER — Inpatient Hospital Stay: Payer: Medicare Other

## 2019-01-06 ENCOUNTER — Inpatient Hospital Stay (HOSPITAL_BASED_OUTPATIENT_CLINIC_OR_DEPARTMENT_OTHER): Payer: Medicare Other | Admitting: Oncology

## 2019-01-06 ENCOUNTER — Encounter: Payer: Self-pay | Admitting: Oncology

## 2019-01-06 ENCOUNTER — Other Ambulatory Visit: Payer: Self-pay

## 2019-01-06 VITALS — BP 123/64 | HR 87 | Temp 96.9°F | Ht 62.0 in | Wt 115.4 lb

## 2019-01-06 VITALS — BP 149/83 | HR 73 | Temp 97.7°F | Resp 20

## 2019-01-06 DIAGNOSIS — R911 Solitary pulmonary nodule: Secondary | ICD-10-CM

## 2019-01-06 DIAGNOSIS — D473 Essential (hemorrhagic) thrombocythemia: Secondary | ICD-10-CM

## 2019-01-06 DIAGNOSIS — D509 Iron deficiency anemia, unspecified: Secondary | ICD-10-CM

## 2019-01-06 DIAGNOSIS — D72829 Elevated white blood cell count, unspecified: Secondary | ICD-10-CM | POA: Diagnosis not present

## 2019-01-06 DIAGNOSIS — D75839 Thrombocytosis, unspecified: Secondary | ICD-10-CM

## 2019-01-06 MED ORDER — SODIUM CHLORIDE 0.9 % IV SOLN
Freq: Once | INTRAVENOUS | Status: AC
  Administered 2019-01-06: 14:00:00 via INTRAVENOUS
  Filled 2019-01-06: qty 250

## 2019-01-06 MED ORDER — IRON SUCROSE 20 MG/ML IV SOLN
200.0000 mg | Freq: Once | INTRAVENOUS | Status: AC
  Administered 2019-01-06: 200 mg via INTRAVENOUS
  Filled 2019-01-06: qty 10

## 2019-01-06 MED ORDER — SODIUM CHLORIDE 0.9 % IV SOLN: 200.0000 mg | INTRAVENOUS | Status: DC

## 2019-01-06 NOTE — Progress Notes (Signed)
Hematology/Oncology Consult note [facility] Telephone:([phone] [phone]   Patient Care Team: Alvester Chou, NP as PCP - General (Nurse Practitioner)  REFERRING PROVIDER: Alvester Chou, NP  CHIEF COMPLAINTS/REASON FOR VISIT:  Evaluation of leukocytosis and weight loss  HISTORY OF PRESENTING ILLNESS:  PAULETTE Long is a  73 y.o.  female with PMH listed below who was referred to me for evaluation of leukocytosis and weight loss Reviewed patient' recent labs obtained by PCP.  09/04/2018 CBC showed elevated white count of 14, differential showed neutrophilia with ANC of 8.2, lymphocytosis with absolute lymphocyte count 3.4, monocytosis 1.2, eosinophil 1.0.  Platelet counts 536000 She also had CEA checked which was normal No previous records available to determine the onset of leukocytosis.  No aggravating or elevated factors. Associated symptoms or signs: Positive for weight loss unintentional.  Cannot specify how many weight she has lost. Denies  fever, chills,night sweats.  Feeling fatigue Smoking history: 80-pack-year smoking history, quit smoking 7 years ago.  History of recent oral steroid use or steroid injection: Denies History of recent infection: She had a pneumonia in November 2018  Lives at home with son.  Son accompany patient to today's visit.  # 11/04/2018 CT chest showed a sub-solid somewhat reticular nodule measuring 1.3 x 8.8 cm in the right upper lobe. There is also borderline enlarged 1 cm he is short axis AP lymph node which is nonspecific. Aortic atherosclerosis and emphysema Coronary atherosclerosis.  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 Bibasilar scarring.  INTERVAL HISTORY Kelsey Long is a 73 y.o. female who has above history reviewed by me today presents for follow up visit for management of leukocytosis, iron deficiency anemia, and weight loss, lung nodule and leukocytosis.    # During the interval, patient has received Venofer  261m IV x 4.  Report fatigue is a lot better.  Denies SOB, chest pain, chronic cough at baseline.   Very Poor historian. Denies any bright red blood or black stool.   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   Weight is 115 pounds today, increased 3 pounds since last visit on 10/28/2018.   Review of Systems  Constitutional: Positive for malaise/fatigue and weight loss. Negative for chills and fever.  HENT: Negative for nosebleeds and sore throat.   Eyes: Negative for double vision, photophobia and redness.  Respiratory: Positive for cough. Negative for shortness of breath and wheezing.   Cardiovascular: Negative for chest pain, palpitations, orthopnea and leg swelling.  Gastrointestinal: Negative for abdominal pain, blood in stool, nausea and vomiting.  Genitourinary: Negative for dysuria.  Musculoskeletal: Negative for back pain, myalgias and neck pain.  Skin: Negative for itching and rash.  Neurological: Negative for dizziness, tingling and tremors.  Endo/Heme/Allergies: Negative for environmental allergies. Does not bruise/bleed easily.  Psychiatric/Behavioral: Negative for depression and hallucinations.    MEDICAL HISTORY:  Past Medical History:  Diagnosis Date  . Anxiety   . Chronic pain   . COPD (chronic obstructive pulmonary disease) ([place]   . Hypertension   . Uterine cancer ([facility]     SURGICAL HISTORY: Past Surgical History:  Procedure Laterality Date  . BILATERAL CARPAL TUNNEL RELEASE    . GALLBLADDER SURGERY    . NECK SURGERY    . TOTAL ABDOMINAL HYSTERECTOMY      SOCIAL HISTORY: Social History   Socioeconomic History  . Marital status: Divorced    Spouse name: Not on file  . Number of children: Not on file  . Years of education: Not on  file  . Highest education level: Not on file  Occupational History  . Not on file  Social Needs  . Financial resource strain: Not on file  . Food insecurity:    Worry: Not on file    Inability: Not on file   . Transportation needs:    Medical: Not on file    Non-medical: Not on file  Tobacco Use  . Smoking status: Former Smoker    Packs/day: 1.00    Years: 40.00    Pack years: 40.00    Last attempt to quit: 12/24/2009    Years since quitting: 9.0  . Smokeless tobacco: Never Used  Substance and Sexual Activity  . Alcohol use: Yes    Alcohol/week: 7.0 standard drinks    Types: 7 Cans of beer per week    Comment: 1 beer daily   . Drug use: Yes    Types: Oxycodone  . Sexual activity: Not on file  Lifestyle  . Physical activity:    Days per week: Not on file    Minutes per session: Not on file  . Stress: Not on file  Relationships  . Social connections:    Talks on phone: Not on file    Gets together: Not on file    Attends religious service: Not on file    Active member of club or organization: Not on file    Attends meetings of clubs or organizations: Not on file    Relationship status: Not on file  . Intimate partner violence:    Fear of current or ex partner: Not on file    Emotionally abused: Not on file    Physically abused: Not on file    Forced sexual activity: Not on file  Other Topics Concern  . Not on file  Social History Narrative  . Not on file    FAMILY HISTORY: Family History  Problem Relation Age of Onset  . Emphysema Mother   . Cancer Mother   . Heart attack Mother   . Heart failure Father   . Emphysema Maternal Uncle   . Asthma Unknown        daughter  . Heart failure Maternal Grandmother     ALLERGIES:  is allergic to penicillins; shellfish allergy; sulfa drugs cross reactors; tetracyclines & related; and tramadol.  MEDICATIONS:  Current Outpatient Medications  Medication Sig Dispense Refill  . albuterol (PROVENTIL HFA;VENTOLIN HFA) 108 (90 BASE) MCG/ACT inhaler Inhale 2 puffs into the lungs every 6 (six) hours as needed for wheezing or shortness of breath.     Marland Kitchen albuterol (PROVENTIL) (2.5 MG/3ML) 0.083% nebulizer solution Take 3 mLs (2.5 mg  total) by nebulization every 6 (six) hours as needed for wheezing or shortness of breath. 75 mL 2  . atorvastatin (LIPITOR) 20 MG tablet Take 20 mg by mouth daily.  5  . benazepril-hydrochlorthiazide (LOTENSIN HCT) 20-12.5 MG per tablet Take 1 tablet by mouth daily.    . budesonide-formoterol (SYMBICORT) 160-4.5 MCG/ACT inhaler Inhale 2 puffs into the lungs 2 (two) times daily.    . Cholecalciferol (VITAMIN D3) 2000 UNITS TABS Take 1 capsule by mouth daily.    . cloNIDine (CATAPRES) 0.1 MG tablet Take 1 tablet (0.1 mg total) by mouth 2 (two) times daily. 60 tablet 11  . DULoxetine (CYMBALTA) 30 MG capsule Take 30 mg by mouth 2 (two) times daily.    . ferrous sulfate 325 (65 FE) MG EC tablet Take 1 tablet (325 mg total) by mouth 2 (two)  times daily with a meal. 60 tablet 3  . gabapentin (NEURONTIN) 600 MG tablet Take 600 mg by mouth at bedtime.     . hydrOXYzine (ATARAX/VISTARIL) 25 MG tablet Take 1 tablet (25 mg total) by mouth every 6 (six) hours as needed. 30 tablet 0  . metoprolol succinate (TOPROL-XL) 50 MG 24 hr tablet Take 50 mg by mouth daily. Take with or immediately following a meal.    . montelukast (SINGULAIR) 10 MG tablet Take 10 mg by mouth at bedtime.     Marland Kitchen MOVANTIK 25 MG TABS tablet Take 1 tablet daily by mouth.    Karma Greaser 4 MG/0.1ML LIQD nasal spray kit 4 mg.  0  . omeprazole (PRILOSEC) 20 MG capsule Take 20 mg by mouth daily.    Marland Kitchen oxybutynin (DITROPAN-XL) 5 MG 24 hr tablet Take 2.5 mg 2 (two) times daily by mouth.    . oxyCODONE (ROXICODONE) 15 MG immediate release tablet Take 1 tablet every 6 (six) hours as needed by mouth.    . potassium chloride (K-DUR) 10 MEQ tablet Take 10 mEq by mouth daily.    Marland Kitchen tiotropium (SPIRIVA) 18 MCG inhalation capsule Place 18 mcg into inhaler and inhale daily.     No current facility-administered medications for this visit.      PHYSICAL EXAMINATION: ECOG PERFORMANCE STATUS: 1 - Symptomatic but completely ambulatory Vitals:   01/06/19 1347    BP: 123/64  Pulse: 87  Temp: (!) 96.9 F (36.1 C)   Filed Weights   01/06/19 1347  Weight: 115 lb 7 oz (52.4 kg)    Physical Exam Constitutional:      General: She is not in acute distress.    Comments: Thin  HENT:     Head: Normocephalic and atraumatic.  Eyes:     General: No scleral icterus.    Pupils: Pupils are equal, round, and reactive to light.  Neck:     Musculoskeletal: Normal range of motion and neck supple.  Cardiovascular:     Rate and Rhythm: Normal rate and regular rhythm.     Heart sounds: Normal heart sounds.  Pulmonary:     Effort: Pulmonary effort is normal. No respiratory distress.     Breath sounds: No wheezing.  Abdominal:     General: Bowel sounds are normal. There is no distension.     Palpations: Abdomen is soft. There is no mass.     Tenderness: There is no abdominal tenderness.  Musculoskeletal: Normal range of motion.        General: No deformity.  Skin:    General: Skin is warm and dry.     Findings: No erythema or rash.  Neurological:     Mental Status: She is alert and oriented to person, place, and time.     Cranial Nerves: No cranial nerve deficit.     Coordination: Coordination normal.  Psychiatric:        Behavior: Behavior normal.        Thought Content: Thought content normal.     CMP Latest Ref Rng & Units 11/04/2018  Glucose 70 - 99 mg/dL 78  BUN 8 - 23 mg/dL 14  Creatinine 0.44 - 1.00 mg/dL 0.73  Sodium 135 - 145 mmol/L 134(L)  Potassium 3.5 - 5.1 mmol/L 3.1(L)  Chloride 98 - 111 mmol/L 94(L)  CO2 22 - 32 mmol/L 29  Calcium 8.9 - 10.3 mg/dL 9.3  Total Protein 6.5 - 8.1 g/dL -  Total Bilirubin 0.3 - 1.2 mg/dL -  Alkaline Phos 38 - 126 U/L -  AST 15 - 41 U/L -  ALT 0 - 44 U/L -   CBC Latest Ref Rng & Units 12/30/2018  WBC 4.0 - 10.5 K/uL 16.0(H)  Hemoglobin 12.0 - 15.0 g/dL 11.8(L)  Hematocrit 36.0 - 46.0 % 35.6(L)  Platelets 150 - 400 K/uL 410(H)    LABORATORY DATA:  I have reviewed the data as listed Lab  Results  Component Value Date   WBC 16.0 (H) 12/30/2018   HGB 11.8 (L) 12/30/2018   HCT 35.6 (L) 12/30/2018   MCV 86.0 12/30/2018   PLT 410 (H) 12/30/2018   Recent Labs    10/07/18 1108 11/04/18 1439  NA 125* 134*  K 3.3* 3.1*  CL 88* 94*  CO2 28 29  GLUCOSE 107* 78  BUN 14 14  CREATININE 0.91 0.73  CALCIUM 9.1 9.3  GFRNONAA >60 >60  GFRAA >60 >60  PROT 6.9  --   ALBUMIN 4.0  --   AST 35  --   ALT 23  --   ALKPHOS 61  --   BILITOT 0.9  --    Iron/TIBC/Ferritin/ %Sat    Component Value Date/Time   IRON 61 12/30/2018 1119   TIBC 309 12/30/2018 1119   FERRITIN 76 12/30/2018 1129   IRONPCTSAT 20 12/30/2018 1119      RADIOGRAPHIC STUDIES: I have personally reviewed the radiological images as listed and agreed with the findings in the report. 11/11/2017 chest x-ray showed right lower lobe pneumonia.  Small right effusion. 10/28/2018 CT chest wo IMPRESSION: 1. Sub solid somewhat reticular nodule measuring 1.3 by 0.8 cm in the right upper lobe. Initial follow-up with CT at 6-12 months is recommended to confirm persistence. If persistent, repeat CT is recommended every 2 years until 5 years of stability has been established. This recommendation follows the consensus statement: Guidelines for Management of Incidental Pulmonary Nodules Detected on CT Images: From the Fleischner Society 2017; Radiology 2017; 284:228-243. 2. There a borderline enlarged 1.0 cm in short axis AP window lymph node which is nonspecific. 3. Aortic Atherosclerosis (ICD10-I70.0) and Emphysema (ICD10-J43.9). 4. Coronary atherosclerosis. 5. Old granulomatous disease.6. Bibasilar scarring.  ASSESSMENT & PLAN:  1. Iron deficiency anemia, unspecified iron deficiency anemia type   2. Solitary pulmonary nodule   3. Thrombocytosis ([place])   4. Leukocytosis, unspecified type    #Labs reviewed and discussed with patient. Iron panel revealed improved ferritin 76. TIBC 309, Iron saturation 20.  Hemoglobin has  improved to 11.8.  Will proceed with another Venofer 220m IV x1 today.  Refer to GI for further work up.   # abnormal CT chest finding, discussed her image finding on tumor board. Will repeat CT scan in 6 months which will be May 2020.   # Leukocytosis and thrombocytosis, JAK 2 V617 mutation negative, CALR, JAK2 E12-15 negative, MPL negative.  Thrombocytosis trended down likely reactive due to iron deficiency.   # Weight loss: weight has been stable since last visit. Gain weight.  # History of endometrial adenocarcinoma, FIGO grade 1. Need re-establish care with gyn. Discussed with patient.   Orders Placed This Encounter  Procedures  . CT Chest Wo Contrast    Standing Status:   Future    Standing Expiration Date:   01/06/2020    Order Specific Question:   Preferred imaging location?    Answer:   [facility]    Order Specific Question:   Radiology Contrast Protocol - do NOT remove file path  Answer:   [email or web address]  . Ambulatory referral to Gastroenterology    Referral Priority:   Routine    Referral Type:   Consultation    Referral Reason:   Specialty Services Required    Referred to Provider:   Jonathon Bellows, MD    Number of Visits Requested:   1    All questions were answered. The patient knows to call the clinic with any problems questions or concerns.  Return of visit: 4 months.   Earlie Server, MD, PhD Hematology Oncology [facility] at [facility] Pager- [phone] 01/06/2019

## 2019-02-10 ENCOUNTER — Ambulatory Visit: Payer: Medicare Other | Admitting: Gastroenterology

## 2019-02-11 ENCOUNTER — Ambulatory Visit (INDEPENDENT_AMBULATORY_CARE_PROVIDER_SITE_OTHER): Payer: Medicare Other | Admitting: Gastroenterology

## 2019-02-11 ENCOUNTER — Encounter: Payer: Self-pay | Admitting: Gastroenterology

## 2019-02-11 ENCOUNTER — Other Ambulatory Visit: Payer: Self-pay

## 2019-02-11 VITALS — BP 135/61 | HR 88 | Ht 62.0 in | Wt 117.4 lb

## 2019-02-11 DIAGNOSIS — D509 Iron deficiency anemia, unspecified: Secondary | ICD-10-CM

## 2019-02-11 DIAGNOSIS — Z791 Long term (current) use of non-steroidal anti-inflammatories (NSAID): Secondary | ICD-10-CM | POA: Diagnosis not present

## 2019-02-11 NOTE — Progress Notes (Signed)
Jonathon Bellows MD, MRCP(U.K) [address]  [place]  [place], [place] [address]  Main: [phone]  Fax: [phone]   Gastroenterology Consultation  Referring Provider:     Earlie Server, MD Primary Care Physician:  Alvester Chou, NP Primary Gastroenterologist:  Dr. Jonathon Bellows  Reason for Consultation:     Iron deficiency anemia        HPI:   Kelsey Long is a 73 y.o. y/o female referred for iron deficiency anemia by Dr Tasia Catchings.  Being evaluated for a nodule in the chest and lymph node. Labs 4 months back showed iron deficiency with a HB of 10.4 grams and MCV 81. On iron replacement .    Rectal bleeding: very rarely sees some blood in her stool. Last episode was 1 month back - occurs with constipation  Nose bleeds: no  Vaginal bleeding : no  Hematemesis or hemoptysis : no  Blood in urine : no  Never had a colonoscopy , possible EGD in the past. She says that naproxyn- BID for 1 year.    Past Medical History:  Diagnosis Date  . Anxiety   . Chronic pain   . COPD (chronic obstructive pulmonary disease) ([place])   . Hypertension   . Uterine cancer [facility])     Past Surgical History:  Procedure Laterality Date  . BILATERAL CARPAL TUNNEL RELEASE    . GALLBLADDER SURGERY    . NECK SURGERY    . TOTAL ABDOMINAL HYSTERECTOMY      Prior to Admission medications   Medication Sig Start Date End Date Taking? Authorizing Provider  albuterol (PROVENTIL HFA;VENTOLIN HFA) 108 (90 BASE) MCG/ACT inhaler Inhale 2 puffs into the lungs every 6 (six) hours as needed for wheezing or shortness of breath.     [provider]  albuterol (PROVENTIL) (2.5 MG/3ML) 0.083% nebulizer solution Take 3 mLs (2.5 mg total) by nebulization every 6 (six) hours as needed for wheezing or shortness of breath. 05/02/15   Ruffian, III Luanna Cole, PA-C  atorvastatin (LIPITOR) 20 MG tablet Take 20 mg by mouth daily. 08/13/18   [provider]  benazepril-hydrochlorthiazide (LOTENSIN HCT) 20-12.5 MG per  tablet Take 1 tablet by mouth daily.    [provider]  budesonide-formoterol (SYMBICORT) 160-4.5 MCG/ACT inhaler Inhale 2 puffs into the lungs 2 (two) times daily.    [provider]  Cholecalciferol (VITAMIN D3) 2000 UNITS TABS Take 1 capsule by mouth daily.    [provider]  cloNIDine (CATAPRES) 0.1 MG tablet Take 1 tablet (0.1 mg total) by mouth 2 (two) times daily. 11/15/17   Bettey Costa, MD  DULoxetine (CYMBALTA) 30 MG capsule Take 30 mg by mouth 2 (two) times daily.    [provider]  ferrous sulfate 325 (65 FE) MG EC tablet Take 1 tablet (325 mg total) by mouth 2 (two) times daily with a meal. 10/21/18   Earlie Server, MD  gabapentin (NEURONTIN) 600 MG tablet Take 600 mg by mouth at bedtime.     [provider]  hydrOXYzine (ATARAX/VISTARIL) 25 MG tablet Take 1 tablet (25 mg total) by mouth every 6 (six) hours as needed. 12/28/15   Triplett, Johnette Abraham B, FNP  metoprolol succinate (TOPROL-XL) 50 MG 24 hr tablet Take 50 mg by mouth daily. Take with or immediately following a meal.    [provider]  montelukast (SINGULAIR) 10 MG tablet Take 10 mg by mouth at bedtime.  09/19/12   [provider]  MOVANTIK 25 MG TABS  tablet Take 1 tablet daily by mouth. 10/16/17   [provider]  NARCAN 4 MG/0.1ML LIQD nasal spray kit 4 mg. 07/11/18   [provider]  omeprazole (PRILOSEC) 20 MG capsule Take 20 mg by mouth daily.    [provider]  oxybutynin (DITROPAN-XL) 5 MG 24 hr tablet Take 2.5 mg 2 (two) times daily by mouth.    [provider]  oxyCODONE (ROXICODONE) 15 MG immediate release tablet Take 1 tablet every 6 (six) hours as needed by mouth. 11/06/17   [provider]  potassium chloride (K-DUR) 10 MEQ tablet Take 10 mEq by mouth daily.    [provider]  tiotropium (SPIRIVA) 18 MCG inhalation capsule Place 18 mcg into inhaler and inhale daily.    [provider]    Family  History  Problem Relation Age of Onset  . Emphysema Mother   . Cancer Mother   . Heart attack Mother   . Heart failure Father   . Emphysema Maternal Uncle   . Asthma Unknown        daughter  . Heart failure Maternal Grandmother      Social History   Tobacco Use  . Smoking status: Former Smoker    Packs/day: 1.00    Years: 40.00    Pack years: 40.00    Last attempt to quit: 12/24/2009    Years since quitting: 9.1  . Smokeless tobacco: Never Used  Substance Use Topics  . Alcohol use: Yes    Alcohol/week: 7.0 standard drinks    Types: 7 Cans of beer per week    Comment: 1 beer daily   . Drug use: Yes    Types: Oxycodone    Allergies as of 02/11/2019 - Review Complete 01/06/2019  Allergen Reaction Noted  . Penicillins Swelling 09/12/2012  . Shellfish allergy  10/07/2018  . Sulfa drugs cross reactors Swelling 09/12/2012  . Tetracyclines & related Swelling 03/13/2014  . Tramadol Rash 12/05/2015    Review of Systems:    All systems reviewed and negative except where noted in HPI.   Physical Exam:  There were no vitals taken for this visit. No LMP recorded. Patient has had a hysterectomy. Psych:  Alert and cooperative. Normal mood and affect. General:   Alert,  Well-developed, well-nourished, pleasant and cooperative in NAD Head:  Normocephalic and atraumatic. Eyes:  Sclera clear, no icterus.   Conjunctiva pink. Ears:  Normal auditory acuity. Nose:  No deformity, discharge, or lesions. Mouth:  Poor dentition  Neck:  Supple; no masses or thyromegaly. Lungs:  Respirations even and unlabored.  Clear throughout to auscultation.   No wheezes, crackles, or rhonchi. No acute distress. Heart:  Regular rate and rhythm; no murmurs, clicks, rubs, or gallops. Abdomen:  Normal bowel sounds.  No bruits.  Soft, non-tender and non-distended without masses, hepatosplenomegaly or hernias noted.  No guarding or rebound tenderness.    Neurologic:  Alert and oriented x3;  grossly normal  neurologically. Skin:  Intact without significant lesions or rashes. No jaundice. Lymph Nodes:  No significant cervical adenopathy. Psych:  Alert and cooperative. Normal mood and affect.  Imaging Studies: No results found.  Assessment and Plan:   Kelsey Long is a 73 y.o. y/o female has been referred for iron deficiency anemia. Check b12,folate, TSH,celiac serology and urine . Also proceed with EGD+colonoscopy +/- capsule study of the small bowel . Possible NSAID's causing anemia.    I have discussed alternative options, risks & benefits,  which include,  but are not limited to, bleeding, infection, perforation,respiratory complication & drug reaction.  The patient agrees with this plan & written consent will be obtained.      Follow up in 8 weeks   Dr Jonathon Bellows MD,MRCP(U.K)

## 2019-02-12 LAB — B12 AND FOLATE PANEL
Folate: 10.2 ng/mL (ref >3.0)
Vitamin B-12: 571 pg/mL (ref 232–1245)

## 2019-02-12 LAB — URINALYSIS
Bilirubin, UA: NEGATIVE
Glucose, UA: NEGATIVE
KETONES UA: NEGATIVE
Nitrite, UA: NEGATIVE
Protein, UA: NEGATIVE
Specific Gravity, UA: 1.009 (ref 1.005–1.030)
Urobilinogen, Ur: 0.2 mg/dL (ref 0.2–1.0)
pH, UA: 6.5 (ref 5.0–7.5)

## 2019-02-12 LAB — TSH: TSH: 0.571 u[IU]/mL (ref 0.450–4.500)

## 2019-02-12 LAB — CELIAC DISEASE AB SCREEN W/RFX
Antigliadin Abs, IgA: 3 units (ref 0–19)
IgA/Immunoglobulin A, Serum: 133 mg/dL (ref 87–352)
Transglutaminase IgA: <2 U/mL (ref 0–3)

## 2019-02-15 ENCOUNTER — Encounter: Payer: Self-pay | Admitting: Gastroenterology

## 2019-02-20 ENCOUNTER — Telehealth: Payer: Self-pay | Admitting: Gastroenterology

## 2019-02-20 NOTE — Telephone Encounter (Signed)
Patient contacted office to cancel due to transportation.  I've provided her with dates for procedures and her son will decide what date works best and pt will call back.  No Fee  Thanks Sharyn Lull

## 2019-02-20 NOTE — Telephone Encounter (Signed)
Patient callled & needs to cancel her colonoscopy scheduled for 02-24-2019 because of not transportation. Please call to resechedule.

## 2019-02-24 ENCOUNTER — Ambulatory Visit: Admission: RE | Admit: 2019-02-24 | Payer: Medicare Other | Source: Home / Self Care | Admitting: Gastroenterology

## 2019-02-24 ENCOUNTER — Encounter: Admission: RE | Payer: Self-pay | Source: Home / Self Care

## 2019-02-24 SURGERY — COLONOSCOPY WITH PROPOFOL
Anesthesia: General

## 2019-03-16 ENCOUNTER — Telehealth: Payer: Self-pay | Admitting: Gastroenterology

## 2019-03-16 NOTE — Telephone Encounter (Signed)
Pt has a couple of questions regarding results please call to clarify

## 2019-03-16 NOTE — Telephone Encounter (Signed)
Called pt to reschedule her apt she states she has a lot of apts coming up and wants to hold of on apt until nurse calls her to clarify report

## 2019-03-16 NOTE — Telephone Encounter (Signed)
Spoke with pt and clarified her lab result letter she received via mail. Pt understands and pt is aware that we will contact her to reschedule her endoscopy procedure. Per Dr. Vicente Males pt needs to have the procedure before he sees her for a follow up in office. Pt understands.

## 2019-03-25 ENCOUNTER — Ambulatory Visit: Payer: Medicare Other | Admitting: Gastroenterology

## 2019-05-04 ENCOUNTER — Other Ambulatory Visit: Payer: Self-pay

## 2019-05-04 ENCOUNTER — Ambulatory Visit: Payer: Medicare Other

## 2019-05-04 ENCOUNTER — Ambulatory Visit
Admission: RE | Admit: 2019-05-04 | Discharge: 2019-05-04 | Disposition: A | Discharge status: Home / Self Care | Payer: Medicare Other | Source: Ambulatory Visit | Attending: Oncology | Admitting: Oncology

## 2019-05-04 DIAGNOSIS — R911 Solitary pulmonary nodule: Secondary | ICD-10-CM | POA: Diagnosis present

## 2019-05-05 ENCOUNTER — Other Ambulatory Visit: Payer: Self-pay

## 2019-05-05 DIAGNOSIS — D509 Iron deficiency anemia, unspecified: Secondary | ICD-10-CM

## 2019-05-06 ENCOUNTER — Other Ambulatory Visit: Payer: Self-pay

## 2019-05-07 ENCOUNTER — Inpatient Hospital Stay: Payer: Medicare Other | Attending: Oncology | Admitting: *Deleted

## 2019-05-07 ENCOUNTER — Other Ambulatory Visit: Payer: Self-pay

## 2019-05-07 DIAGNOSIS — D509 Iron deficiency anemia, unspecified: Secondary | ICD-10-CM | POA: Diagnosis present

## 2019-05-07 LAB — CBC WITH DIFFERENTIAL/PLATELET
Abs Immature Granulocytes: 0.06 10*3/uL (ref 0.00–0.07)
Basophils Absolute: 0.1 10*3/uL (ref 0.0–0.1)
Basophils Relative: 1 %
Eosinophils Absolute: 0.6 10*3/uL — ABNORMAL HIGH (ref 0.0–0.5)
Eosinophils Relative: 4 %
HCT: 38.3 % (ref 36.0–46.0)
Hemoglobin: 12.9 g/dL (ref 12.0–15.0)
Immature Granulocytes: 0 %
Lymphocytes Relative: 23 %
Lymphs Abs: 3.4 10*3/uL (ref 0.7–4.0)
MCH: 30.5 pg (ref 26.0–34.0)
MCHC: 33.7 g/dL (ref 30.0–36.0)
MCV: 90.5 fL (ref 80.0–100.0)
Monocytes Absolute: 1.5 10*3/uL — ABNORMAL HIGH (ref 0.1–1.0)
Monocytes Relative: 10 %
Neutro Abs: 9.7 10*3/uL — ABNORMAL HIGH (ref 1.7–7.7)
Neutrophils Relative %: 62 %
Platelets: 416 10*3/uL — ABNORMAL HIGH (ref 150–400)
RBC: 4.23 MIL/uL (ref 3.87–5.11)
RDW: 13.8 % (ref 11.5–15.5)
WBC: 15.2 10*3/uL — ABNORMAL HIGH (ref 4.0–10.5)
nRBC: 0 % (ref 0.0–0.2)

## 2019-05-07 LAB — IRON AND TIBC
Iron: 76 ug/dL (ref 28–170)
Saturation Ratios: 24 % (ref 10.4–31.8)
TIBC: 315 ug/dL (ref 250–450)
UIBC: 239 ug/dL

## 2019-05-07 LAB — FERRITIN: Ferritin: 111 ng/mL (ref 11–307)

## 2019-05-08 ENCOUNTER — Inpatient Hospital Stay: Payer: Medicare Other

## 2019-05-08 ENCOUNTER — Inpatient Hospital Stay: Payer: Medicare Other | Admitting: Oncology

## 2019-05-11 ENCOUNTER — Other Ambulatory Visit: Payer: Self-pay

## 2019-05-12 ENCOUNTER — Inpatient Hospital Stay (HOSPITAL_BASED_OUTPATIENT_CLINIC_OR_DEPARTMENT_OTHER): Payer: Medicare Other | Admitting: Oncology

## 2019-05-12 ENCOUNTER — Encounter: Payer: Self-pay | Admitting: Oncology

## 2019-05-12 ENCOUNTER — Other Ambulatory Visit: Payer: Self-pay

## 2019-05-12 DIAGNOSIS — R911 Solitary pulmonary nodule: Secondary | ICD-10-CM

## 2019-05-12 DIAGNOSIS — Z87891 Personal history of nicotine dependence: Secondary | ICD-10-CM

## 2019-05-12 DIAGNOSIS — Z79899 Other long term (current) drug therapy: Secondary | ICD-10-CM

## 2019-05-12 DIAGNOSIS — D75839 Thrombocytosis, unspecified: Secondary | ICD-10-CM

## 2019-05-12 DIAGNOSIS — D509 Iron deficiency anemia, unspecified: Secondary | ICD-10-CM | POA: Diagnosis not present

## 2019-05-12 DIAGNOSIS — D473 Essential (hemorrhagic) thrombocythemia: Secondary | ICD-10-CM | POA: Diagnosis not present

## 2019-05-12 DIAGNOSIS — D72829 Elevated white blood cell count, unspecified: Secondary | ICD-10-CM

## 2019-05-12 DIAGNOSIS — I10 Essential (primary) hypertension: Secondary | ICD-10-CM

## 2019-05-12 NOTE — Progress Notes (Signed)
Called patient today for Telehealth visit via telephone.  Patient states no new concerns

## 2019-05-16 NOTE — Progress Notes (Signed)
HEMATOLOGY-ONCOLOGY TeleHEALTH VISIT PROGRESS NOTE  I connected with Kelsey Long on 05/16/19 at  2:15 PM EDT by video enabled telemedicine visit and verified that I am speaking with the correct person using two identifiers. I discussed the limitations, risks, security and privacy concerns of performing an evaluation and management service by telemedicine and the availability of in-person appointments. I also discussed with the patient that there may be a patient responsible charge related to this service. The patient expressed understanding and agreed to proceed.   Other persons participating in the visit and their role in the encounter:  Geraldine Solar, CMA, check in patient     Patient's location: Home  Provider's location: work Chief Complaint: follow up for IDA, thrombocytosis, leukocytosis   INTERVAL HISTORY Kelsey Long is a 73 y.o. female who has above history reviewed by me today presents for follow up visit for management of IDA, thrombocytosis, leukocytosis Problems and complaints are listed below:  Patient reports doing well. No new complaints.  Denies weight loss, fever, chills, fatigue, night sweats.   Review of Systems  Constitutional: Negative for appetite change, chills, fatigue and fever.  HENT:   Negative for hearing loss and voice change.   Eyes: Negative for eye problems.  Respiratory: Negative for chest tightness and cough.   Cardiovascular: Negative for chest pain.  Gastrointestinal: Negative for abdominal distention, abdominal pain and blood in stool.  Endocrine: Negative for hot flashes.  Genitourinary: Negative for difficulty urinating and frequency.   Musculoskeletal: Negative for arthralgias.  Skin: Negative for itching and rash.  Neurological: Negative for extremity weakness.  Hematological: Negative for adenopathy.  Psychiatric/Behavioral: Negative for confusion.    Past Medical History:  Diagnosis Date  . Anxiety   . Chronic pain   . COPD  (chronic obstructive pulmonary disease) ([place])   . Hypertension   . Uterine cancer [facility])    Past Surgical History:  Procedure Laterality Date  . BILATERAL CARPAL TUNNEL RELEASE    . GALLBLADDER SURGERY    . NECK SURGERY    . TOTAL ABDOMINAL HYSTERECTOMY      Family History  Problem Relation Age of Onset  . Emphysema Mother   . Cancer Mother   . Heart attack Mother   . Heart failure Father   . Emphysema Maternal Uncle   . Asthma Unknown        daughter  . Heart failure Maternal Grandmother     Social History   Socioeconomic History  . Marital status: Divorced    Spouse name: Not on file  . Number of children: Not on file  . Years of education: Not on file  . Highest education level: Not on file  Occupational History  . Not on file  Social Needs  . Financial resource strain: Not on file  . Food insecurity:    Worry: Not on file    Inability: Not on file  . Transportation needs:    Medical: Not on file    Non-medical: Not on file  Tobacco Use  . Smoking status: Former Smoker    Packs/day: 1.00    Years: 40.00    Pack years: 40.00    Last attempt to quit: 12/24/2009    Years since quitting: 9.3  . Smokeless tobacco: Never Used  Substance and Sexual Activity  . Alcohol use: Yes    Alcohol/week: 7.0 standard drinks    Types: 7 Cans of beer per week    Comment: 1 beer daily   .  Drug use: Yes    Types: Oxycodone  . Sexual activity: Not on file  Lifestyle  . Physical activity:    Days per week: Not on file    Minutes per session: Not on file  . Stress: Not on file  Relationships  . Social connections:    Talks on phone: Not on file    Gets together: Not on file    Attends religious service: Not on file    Active member of club or organization: Not on file    Attends meetings of clubs or organizations: Not on file    Relationship status: Not on file  . Intimate partner violence:    Fear of current or ex partner: Not on file    Emotionally abused: Not on file     Physically abused: Not on file    Forced sexual activity: Not on file  Other Topics Concern  . Not on file  Social History Narrative  . Not on file    Current Outpatient Medications on File Prior to Visit  Medication Sig Dispense Refill  . albuterol (PROVENTIL HFA;VENTOLIN HFA) 108 (90 BASE) MCG/ACT inhaler Inhale 2 puffs into the lungs every 6 (six) hours as needed for wheezing or shortness of breath.     Marland Kitchen albuterol (PROVENTIL) (2.5 MG/3ML) 0.083% nebulizer solution Take 3 mLs (2.5 mg total) by nebulization every 6 (six) hours as needed for wheezing or shortness of breath. 75 mL 2  . atorvastatin (LIPITOR) 20 MG tablet Take 20 mg by mouth daily.  5  . benazepril-hydrochlorthiazide (LOTENSIN HCT) 20-12.5 MG per tablet Take 1 tablet by mouth daily.    . budesonide-formoterol (SYMBICORT) 160-4.5 MCG/ACT inhaler Inhale 2 puffs into the lungs 2 (two) times daily.    . Cholecalciferol (VITAMIN D3) 2000 UNITS TABS Take 1 capsule by mouth daily.    . DULoxetine (CYMBALTA) 30 MG capsule Take 30 mg by mouth 2 (two) times daily.    . ferrous sulfate 325 (65 FE) MG EC tablet Take 1 tablet (325 mg total) by mouth 2 (two) times daily with a meal. 60 tablet 3  . gabapentin (NEURONTIN) 600 MG tablet Take 600 mg by mouth at bedtime.     . metoprolol succinate (TOPROL-XL) 50 MG 24 hr tablet Take 50 mg by mouth daily. Take with or immediately following a meal.    . MOVANTIK 25 MG TABS tablet Take 1 tablet daily by mouth.    Marland Kitchen omeprazole (PRILOSEC) 20 MG capsule Take 20 mg by mouth daily.    Marland Kitchen oxybutynin (DITROPAN-XL) 5 MG 24 hr tablet Take 2.5 mg 2 (two) times daily by mouth.    . oxyCODONE (ROXICODONE) 15 MG immediate release tablet Take 1 tablet every 6 (six) hours as needed by mouth.    . potassium chloride (K-DUR) 10 MEQ tablet Take 10 mEq by mouth daily.    Marland Kitchen tiotropium (SPIRIVA) 18 MCG inhalation capsule Place 18 mcg into inhaler and inhale daily.    . montelukast (SINGULAIR) 10 MG tablet Take 10  mg by mouth at bedtime.     Marland Kitchen NARCAN 4 MG/0.1ML LIQD nasal spray kit 4 mg.  0   No current facility-administered medications on file prior to visit.     Allergies  Allergen Reactions  . Penicillins Swelling    Has patient had a PCN reaction causing immediate rash, facial/tongue/throat swelling, SOB or lightheadedness with hypotension: No Has patient had a PCN reaction causing severe rash involving mucus membranes or skin necrosis:  No Has patient had a PCN reaction that required hospitalization: No Has patient had a PCN reaction occurring within the last 10 years: No If all of the above answers are "NO", then may proceed with Cephalosporin use.   . Shellfish Allergy   . Sulfa Drugs Cross Reactors Swelling  . Tetracyclines & Related Swelling  . Tramadol Rash       Observations/Objective: There were no vitals filed for this visit. There is no height or weight on file to calculate BMI.  Physical Exam  CBC    Component Value Date/Time   WBC 15.2 (H) 05/07/2019 1321   RBC 4.23 05/07/2019 1321   HGB 12.9 05/07/2019 1321   HCT 38.3 05/07/2019 1321   PLT 416 (H) 05/07/2019 1321   MCV 90.5 05/07/2019 1321   MCH 30.5 05/07/2019 1321   MCHC 33.7 05/07/2019 1321   RDW 13.8 05/07/2019 1321   LYMPHSABS 3.4 05/07/2019 1321   MONOABS 1.5 (H) 05/07/2019 1321   EOSABS 0.6 (H) 05/07/2019 1321   BASOSABS 0.1 05/07/2019 1321    CMP     Component Value Date/Time   NA 134 (L) 11/04/2018 1439   K 3.1 (L) 11/04/2018 1439   CL 94 (L) 11/04/2018 1439   CO2 29 11/04/2018 1439   GLUCOSE 78 11/04/2018 1439   BUN 14 11/04/2018 1439   CREATININE 0.73 11/04/2018 1439   CALCIUM 9.3 11/04/2018 1439   PROT 6.9 10/07/2018 1108   ALBUMIN 4.0 10/07/2018 1108   AST 35 10/07/2018 1108   ALT 23 10/07/2018 1108   ALKPHOS 61 10/07/2018 1108   BILITOT 0.9 10/07/2018 1108   GFRNONAA >60 11/04/2018 1439   GFRAA >60 11/04/2018 1439     RADIOGRAPHIC STUDIES: I have personally reviewed the radiological  images as listed and agreed with the findings in the report. 05/04/2019 CT chest wo contrast IMPRESSION: 1. Stable subsolid 1.3 cm opacity in the anterior right upper lobe. More conspicuous on today's exam is the presence of calcifications within this nodule. This suggests that is almost certainly a benign process. 2. Stable 5 mm pulmonary nodule in the right lower lobe. No follow-up needed if patient is low-risk. Non-contrast chest CT can be considered in 12 months if patient is high-risk. This recommendation follows the consensus statement: Guidelines for Management of Incidental Pulmonary Nodules Detected on CT Images: From the Fleischner Society 2017; Radiology 2017; 284:228-243. 3. Moderate emphysematous changes bilaterally. 4. Bilateral thyroid nodules. These can be further evaluated with a nonemergent outpatient thyroid ultrasound as clinically indicated   Assessment and Plan: 1. Iron deficiency anemia, unspecified iron deficiency anemia type   2. Solitary pulmonary nodule   3. Leukocytosis, unspecified type   4. Thrombocytosis (HCC)     CBC showed stable hemoglobin. Iron panel showed improved iron store.  Hold additional IV iron treatment.   Persistent leukocytosis,  JAK 2 V617 mutation negative, CALR, JAK2 E12-15 negative, MPL negative.  Will check [facility] ABL.   # thrombocytosis, stable.  # Lung nodule. Stable.  CT chest was reviewed and discussed with patient.  Former smoker,    Follow Up Instructions: Lab md 4 months.   I discussed the assessment and treatment plan with the patient. The patient was provided an opportunity to ask questions and all were answered. The patient agreed with the plan and demonstrated an understanding of the instructions.  The patient was advised to call back or seek an in-person evaluation if the symptoms worsen or if the condition fails to improve as  anticipated.   I provided 12 minutes of non face-to-face telephone visit time during this  encounter, and > 50% was spent counseling as documented under my assessment & plan.  Earlie Server, MD 05/16/2019 1:51 PM

## 2019-06-22 ENCOUNTER — Other Ambulatory Visit: Payer: Self-pay

## 2019-06-22 ENCOUNTER — Telehealth: Payer: Self-pay | Admitting: Oncology

## 2019-06-22 NOTE — Telephone Encounter (Signed)
Spoke with pt to confirm appt date/time, do pre-appt screen which was completed, and adv of Covid-19 guidelines for appt regarding screening questions, temperature check, face mask required, and no visitors allowed °

## 2019-06-23 ENCOUNTER — Other Ambulatory Visit: Payer: Self-pay

## 2019-06-23 ENCOUNTER — Inpatient Hospital Stay: Payer: Medicare Other

## 2019-06-30 ENCOUNTER — Other Ambulatory Visit: Payer: Medicare Other

## 2019-07-01 ENCOUNTER — Other Ambulatory Visit: Payer: Self-pay

## 2019-07-01 ENCOUNTER — Telehealth: Payer: Self-pay | Admitting: Oncology

## 2019-07-01 NOTE — Telephone Encounter (Signed)
Spoke with pt to confirm appt date/time, do pre-appt screen which was completed, and adv of Covid-19 guidelines for appt regarding screening questions, temperature check, face mask required, and no visitors allowed °

## 2019-07-02 ENCOUNTER — Other Ambulatory Visit: Payer: Self-pay

## 2019-07-02 ENCOUNTER — Inpatient Hospital Stay: Payer: Medicare Other | Attending: Oncology

## 2019-07-02 DIAGNOSIS — D509 Iron deficiency anemia, unspecified: Secondary | ICD-10-CM | POA: Insufficient documentation

## 2019-07-02 DIAGNOSIS — Z79899 Other long term (current) drug therapy: Secondary | ICD-10-CM | POA: Insufficient documentation

## 2019-07-02 DIAGNOSIS — D473 Essential (hemorrhagic) thrombocythemia: Secondary | ICD-10-CM | POA: Diagnosis not present

## 2019-07-02 DIAGNOSIS — D72829 Elevated white blood cell count, unspecified: Secondary | ICD-10-CM

## 2019-07-09 LAB — BCR-ABL1 FISH
Cells Analyzed: 200
Cells Counted: 200

## 2019-08-14 DIAGNOSIS — E559 Vitamin D deficiency, unspecified: Secondary | ICD-10-CM | POA: Insufficient documentation

## 2019-08-14 DIAGNOSIS — M069 Rheumatoid arthritis, unspecified: Secondary | ICD-10-CM | POA: Insufficient documentation

## 2019-08-14 DIAGNOSIS — K2100 Gastro-esophageal reflux disease with esophagitis, without bleeding: Secondary | ICD-10-CM | POA: Insufficient documentation

## 2019-08-14 DIAGNOSIS — I10 Essential (primary) hypertension: Secondary | ICD-10-CM | POA: Insufficient documentation

## 2019-08-14 DIAGNOSIS — J449 Chronic obstructive pulmonary disease, unspecified: Secondary | ICD-10-CM | POA: Insufficient documentation

## 2019-08-17 ENCOUNTER — Other Ambulatory Visit: Payer: Self-pay | Admitting: Family

## 2019-08-17 DIAGNOSIS — Z1231 Encounter for screening mammogram for malignant neoplasm of breast: Secondary | ICD-10-CM

## 2019-09-09 ENCOUNTER — Inpatient Hospital Stay: Payer: Medicare Other

## 2019-09-11 ENCOUNTER — Inpatient Hospital Stay: Payer: Medicare Other | Admitting: Oncology

## 2019-09-11 ENCOUNTER — Inpatient Hospital Stay: Payer: Medicare Other

## 2019-09-23 ENCOUNTER — Other Ambulatory Visit: Payer: Self-pay

## 2019-09-23 ENCOUNTER — Inpatient Hospital Stay: Payer: Medicare Other | Attending: Oncology

## 2019-09-23 DIAGNOSIS — I10 Essential (primary) hypertension: Secondary | ICD-10-CM | POA: Insufficient documentation

## 2019-09-23 DIAGNOSIS — D473 Essential (hemorrhagic) thrombocythemia: Secondary | ICD-10-CM | POA: Insufficient documentation

## 2019-09-23 DIAGNOSIS — R911 Solitary pulmonary nodule: Secondary | ICD-10-CM | POA: Diagnosis not present

## 2019-09-23 DIAGNOSIS — Z79899 Other long term (current) drug therapy: Secondary | ICD-10-CM | POA: Diagnosis not present

## 2019-09-23 DIAGNOSIS — D509 Iron deficiency anemia, unspecified: Secondary | ICD-10-CM | POA: Insufficient documentation

## 2019-09-23 DIAGNOSIS — D72829 Elevated white blood cell count, unspecified: Secondary | ICD-10-CM | POA: Insufficient documentation

## 2019-09-23 DIAGNOSIS — Z87891 Personal history of nicotine dependence: Secondary | ICD-10-CM | POA: Diagnosis not present

## 2019-09-23 LAB — CBC WITH DIFFERENTIAL/PLATELET
Abs Immature Granulocytes: 0.06 10*3/uL (ref 0.00–0.07)
Basophils Absolute: 0.1 10*3/uL (ref 0.0–0.1)
Basophils Relative: 1 %
Eosinophils Absolute: 0.8 10*3/uL — ABNORMAL HIGH (ref 0.0–0.5)
Eosinophils Relative: 6 %
HCT: 36.7 % (ref 36.0–46.0)
Hemoglobin: 12.5 g/dL (ref 12.0–15.0)
Immature Granulocytes: 0 %
Lymphocytes Relative: 19 %
Lymphs Abs: 2.6 10*3/uL (ref 0.7–4.0)
MCH: 30.9 pg (ref 26.0–34.0)
MCHC: 34.1 g/dL (ref 30.0–36.0)
MCV: 90.6 fL (ref 80.0–100.0)
Monocytes Absolute: 1.1 10*3/uL — ABNORMAL HIGH (ref 0.1–1.0)
Monocytes Relative: 8 %
Neutro Abs: 9.4 10*3/uL — ABNORMAL HIGH (ref 1.7–7.7)
Neutrophils Relative %: 66 %
Platelets: 419 10*3/uL — ABNORMAL HIGH (ref 150–400)
RBC: 4.05 MIL/uL (ref 3.87–5.11)
RDW: 12.3 % (ref 11.5–15.5)
WBC: 14 10*3/uL — ABNORMAL HIGH (ref 4.0–10.5)
nRBC: 0 % (ref 0.0–0.2)

## 2019-09-23 LAB — COMPREHENSIVE METABOLIC PANEL
ALT: 18 U/L (ref 0–44)
AST: 24 U/L (ref 15–41)
Albumin: 3.9 g/dL (ref 3.5–5.0)
Alkaline Phosphatase: 66 U/L (ref 38–126)
Anion gap: 9 (ref 5–15)
BUN: 15 mg/dL (ref 8–23)
CO2: 27 mmol/L (ref 22–32)
Calcium: 9.4 mg/dL (ref 8.9–10.3)
Chloride: 94 mmol/L — ABNORMAL LOW (ref 98–111)
Creatinine, Ser: 0.87 mg/dL (ref 0.44–1.00)
GFR calc Af Amer: >60 mL/min (ref >60)
GFR calc non Af Amer: >60 mL/min (ref >60)
Glucose, Bld: 111 mg/dL — ABNORMAL HIGH (ref 70–99)
Potassium: 3.5 mmol/L (ref 3.5–5.1)
Sodium: 130 mmol/L — ABNORMAL LOW (ref 135–145)
Total Bilirubin: 0.7 mg/dL (ref 0.3–1.2)
Total Protein: 6.8 g/dL (ref 6.5–8.1)

## 2019-09-23 LAB — IRON AND TIBC
Iron: 77 ug/dL (ref 28–170)
Saturation Ratios: 24 % (ref 10.4–31.8)
TIBC: 327 ug/dL (ref 250–450)
UIBC: 250 ug/dL

## 2019-09-23 LAB — FERRITIN: Ferritin: 94 ng/mL (ref 11–307)

## 2019-09-24 ENCOUNTER — Other Ambulatory Visit: Payer: Self-pay

## 2019-09-24 ENCOUNTER — Encounter: Payer: Self-pay | Admitting: Oncology

## 2019-09-24 NOTE — Progress Notes (Signed)
Patient stated that she had been doing well with no complaints. 

## 2019-09-25 ENCOUNTER — Inpatient Hospital Stay: Payer: Medicare Other

## 2019-09-25 ENCOUNTER — Inpatient Hospital Stay: Payer: Medicare Other | Admitting: Oncology

## 2019-10-02 ENCOUNTER — Encounter: Payer: Self-pay | Admitting: Oncology

## 2019-10-02 ENCOUNTER — Other Ambulatory Visit: Payer: Self-pay

## 2019-10-02 NOTE — Progress Notes (Signed)
Patient prescreened for appointment. Pt wanting to know more about results of CT chest done in May.

## 2019-10-05 ENCOUNTER — Inpatient Hospital Stay: Payer: Medicare Other

## 2019-10-05 ENCOUNTER — Inpatient Hospital Stay: Payer: Medicare Other | Admitting: Oncology

## 2019-10-13 ENCOUNTER — Encounter: Payer: Self-pay | Admitting: Oncology

## 2019-10-13 ENCOUNTER — Other Ambulatory Visit: Payer: Self-pay

## 2019-10-13 NOTE — Progress Notes (Signed)
Patient pre screened for office appointment, no questions or concerns today. 

## 2019-10-14 ENCOUNTER — Inpatient Hospital Stay: Payer: Medicare Other

## 2019-10-14 ENCOUNTER — Other Ambulatory Visit: Payer: Self-pay

## 2019-10-14 ENCOUNTER — Inpatient Hospital Stay: Payer: Medicare Other | Attending: Oncology | Admitting: Oncology

## 2019-10-14 VITALS — BP 151/75 | HR 91 | Temp 99.5°F | Resp 18 | Wt 113.5 lb

## 2019-10-14 DIAGNOSIS — J449 Chronic obstructive pulmonary disease, unspecified: Secondary | ICD-10-CM | POA: Insufficient documentation

## 2019-10-14 DIAGNOSIS — F419 Anxiety disorder, unspecified: Secondary | ICD-10-CM | POA: Diagnosis not present

## 2019-10-14 DIAGNOSIS — Z8542 Personal history of malignant neoplasm of other parts of uterus: Secondary | ICD-10-CM | POA: Diagnosis not present

## 2019-10-14 DIAGNOSIS — Z90710 Acquired absence of both cervix and uterus: Secondary | ICD-10-CM | POA: Diagnosis not present

## 2019-10-14 DIAGNOSIS — R634 Abnormal weight loss: Secondary | ICD-10-CM | POA: Diagnosis not present

## 2019-10-14 DIAGNOSIS — I10 Essential (primary) hypertension: Secondary | ICD-10-CM | POA: Diagnosis not present

## 2019-10-14 DIAGNOSIS — Z7951 Long term (current) use of inhaled steroids: Secondary | ICD-10-CM | POA: Insufficient documentation

## 2019-10-14 DIAGNOSIS — D75839 Thrombocytosis, unspecified: Secondary | ICD-10-CM

## 2019-10-14 DIAGNOSIS — D509 Iron deficiency anemia, unspecified: Secondary | ICD-10-CM | POA: Diagnosis not present

## 2019-10-14 DIAGNOSIS — E041 Nontoxic single thyroid nodule: Secondary | ICD-10-CM

## 2019-10-14 DIAGNOSIS — D473 Essential (hemorrhagic) thrombocythemia: Secondary | ICD-10-CM

## 2019-10-14 DIAGNOSIS — Z809 Family history of malignant neoplasm, unspecified: Secondary | ICD-10-CM | POA: Diagnosis not present

## 2019-10-14 DIAGNOSIS — D72829 Elevated white blood cell count, unspecified: Secondary | ICD-10-CM | POA: Insufficient documentation

## 2019-10-14 DIAGNOSIS — Z79899 Other long term (current) drug therapy: Secondary | ICD-10-CM | POA: Diagnosis not present

## 2019-10-14 DIAGNOSIS — E042 Nontoxic multinodular goiter: Secondary | ICD-10-CM | POA: Diagnosis not present

## 2019-10-14 DIAGNOSIS — Z87891 Personal history of nicotine dependence: Secondary | ICD-10-CM | POA: Diagnosis not present

## 2019-10-14 NOTE — Progress Notes (Signed)
Hematology/Oncology follow up note [facility] [phone] [phone]   Patient Care Team: Alvester Chou, NP as PCP - General (Nurse Practitioner)  REFERRING PROVIDER: Alvester Chou, NP  CHIEF COMPLAINTS/REASON FOR VISIT:  Follow up of  leukocytosis and weight loss  HISTORY OF PRESENTING ILLNESS:  Kelsey Long is a  73 y.o.  female with PMH listed below who was referred to me for evaluation of leukocytosis and weight loss Reviewed patient' recent labs obtained by PCP.  09/04/2018 CBC showed elevated white count of 14, differential showed neutrophilia with ANC of 8.2, lymphocytosis with absolute lymphocyte count 3.4, monocytosis 1.2, eosinophil 1.0.  Platelet counts 536000 She also had CEA checked which was normal No previous records available to determine the onset of leukocytosis.  No aggravating or elevated factors. Associated symptoms or signs: Positive for weight loss unintentional.  Cannot specify how many weight she has lost. Denies  fever, chills,night sweats.  Feeling fatigue Smoking history: 80-pack-year smoking history, quit smoking 7 years ago.  History of recent oral steroid use or steroid injection: Denies History of recent infection: She had a pneumonia in November 2018  Lives at home with son.  Son accompany patient to today's visit.  # 11/04/2018 CT chest showed a sub-solid somewhat reticular nodule measuring 1.3 x 8.8 cm in the right upper lobe. There is also borderline enlarged 1 cm he is short axis AP lymph node which is nonspecific. Aortic atherosclerosis and emphysema Coronary atherosclerosis.  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 Bibasilar scarring.  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  INTERVAL HISTORY Kelsey Long is a 73 y.o. female who has above history reviewed by me today presents for follow up visit for management of leukocytosis, iron deficiency anemia, and weight loss, lung nodule and leukocytosis.     She no showed for her follow-up appointments and rescheduled. Patient has received IV Venofer 200 mg x 4 doses. Iron deficiency has improved.  reports that her fatigue is a lot better now. Denies any new complaints today. Appetite is fair.  She lost 4 pounds since February 2020. Reports that she has a lot of stress recently.  Her son and daughter-in-law as marriage counseling this afternoon. Chronic fatigue at baseline. Review of Systems  Constitutional: Positive for malaise/fatigue and weight loss. Negative for chills and fever.  HENT: Negative for nosebleeds and sore throat.   Eyes: Negative for double vision, photophobia and redness.  Respiratory: Positive for cough. Negative for shortness of breath and wheezing.   Cardiovascular: Negative for chest pain, palpitations, orthopnea and leg swelling.  Gastrointestinal: Negative for abdominal pain, blood in stool, nausea and vomiting.  Genitourinary: Negative for dysuria.  Musculoskeletal: Negative for back pain, myalgias and neck pain.  Skin: Negative for itching and rash.  Neurological: Negative for dizziness, tingling and tremors.  Endo/Heme/Allergies: Negative for environmental allergies. Does not bruise/bleed easily.  Psychiatric/Behavioral: Negative for depression and hallucinations.    MEDICAL HISTORY:  Past Medical History:  Diagnosis Date   Anxiety    Chronic pain    COPD (chronic obstructive pulmonary disease) (HCC)    Hypertension    Uterine cancer (HCC)     SURGICAL HISTORY: Past Surgical History:  Procedure Laterality Date   BILATERAL CARPAL TUNNEL RELEASE     GALLBLADDER SURGERY     NECK SURGERY     TOTAL ABDOMINAL HYSTERECTOMY      SOCIAL HISTORY: Social History   Socioeconomic History   Marital status: Divorced    Spouse name: Not on  file   Number of children: Not on file   Years of education: Not on file   Highest education level: Not on file  Occupational History   Not on file   Social Needs   Financial resource strain: Not on file   Food insecurity    Worry: Not on file    Inability: Not on file   Transportation needs    Medical: Not on file    Non-medical: Not on file  Tobacco Use   Smoking status: Former Smoker    Packs/day: 1.00    Years: 40.00    Pack years: 40.00    Quit date: 12/24/2009    Years since quitting: 9.8   Smokeless tobacco: Never Used  Substance and Sexual Activity   Alcohol use: Yes    Alcohol/week: 7.0 standard drinks    Types: 7 Cans of beer per week    Comment: 1 beer daily    Drug use: Yes    Types: Oxycodone   Sexual activity: Not on file  Lifestyle   Physical activity    Days per week: Not on file    Minutes per session: Not on file   Stress: Not on file  Relationships   Social connections    Talks on phone: Not on file    Gets together: Not on file    Attends religious service: Not on file    Active member of club or organization: Not on file    Attends meetings of clubs or organizations: Not on file    Relationship status: Not on file   Intimate partner violence    Fear of current or ex partner: Not on file    Emotionally abused: Not on file    Physically abused: Not on file    Forced sexual activity: Not on file  Other Topics Concern   Not on file  Social History Narrative   Not on file    FAMILY HISTORY: Family History  Problem Relation Age of Onset   Emphysema Mother    Cancer Mother    Heart attack Mother    Heart failure Father    Emphysema Maternal Uncle    Asthma Unknown        daughter   Heart failure Maternal Grandmother     ALLERGIES:  is allergic to penicillins; shellfish allergy; sulfa drugs cross reactors; tetracyclines & related; and tramadol.  MEDICATIONS:  Current Outpatient Medications  Medication Sig Dispense Refill   albuterol (PROVENTIL HFA;VENTOLIN HFA) 108 (90 BASE) MCG/ACT inhaler Inhale 2 puffs into the lungs every 6 (six) hours as needed for wheezing  or shortness of breath.      albuterol (PROVENTIL) (2.5 MG/3ML) 0.083% nebulizer solution Take 3 mLs (2.5 mg total) by nebulization every 6 (six) hours as needed for wheezing or shortness of breath. 75 mL 2   alendronate (FOSAMAX) 70 MG tablet Take 1 tablet by mouth once a week.     atorvastatin (LIPITOR) 20 MG tablet Take 20 mg by mouth daily.  5   benazepril-hydrochlorthiazide (LOTENSIN HCT) 20-12.5 MG per tablet Take 1 tablet by mouth daily.     budesonide-formoterol (SYMBICORT) 160-4.5 MCG/ACT inhaler Inhale 2 puffs into the lungs 2 (two) times daily.     Cholecalciferol (VITAMIN D3) 2000 UNITS TABS Take 1 capsule by mouth daily.     DULoxetine (CYMBALTA) 30 MG capsule Take 30 mg by mouth 2 (two) times daily.     ferrous sulfate 325 (65 FE) MG EC tablet Take  1 tablet (325 mg total) by mouth 2 (two) times daily with a meal. 60 tablet 3   gabapentin (NEURONTIN) 600 MG tablet Take 600 mg by mouth at bedtime.      metoprolol succinate (TOPROL-XL) 50 MG 24 hr tablet Take 50 mg by mouth daily. Take with or immediately following a meal.     montelukast (SINGULAIR) 10 MG tablet Take 10 mg by mouth at bedtime.      MOVANTIK 25 MG TABS tablet Take 1 tablet daily by mouth.     NARCAN 4 MG/0.1ML LIQD nasal spray kit 4 mg.  0   omeprazole (PRILOSEC) 20 MG capsule Take 20 mg by mouth daily.     oxybutynin (DITROPAN-XL) 5 MG 24 hr tablet Take 2.5 mg 2 (two) times daily by mouth.     oxyCODONE (ROXICODONE) 15 MG immediate release tablet Take 1 tablet every 6 (six) hours as needed by mouth.     potassium chloride (K-DUR) 10 MEQ tablet Take 10 mEq by mouth daily.     tiotropium (SPIRIVA) 18 MCG inhalation capsule Place 18 mcg into inhaler and inhale daily.     No current facility-administered medications for this visit.      PHYSICAL EXAMINATION: ECOG PERFORMANCE STATUS: 1 - Symptomatic but completely ambulatory Vitals:   10/14/19 1315  BP: (!) 151/75  Pulse: 91  Resp: 18  Temp:  99.5 F (37.5 C)   Filed Weights   10/14/19 1315  Weight: 113 lb 8 oz (51.5 kg)    Physical Exam Constitutional:      General: She is not in acute distress.    Comments: Thin  HENT:     Head: Normocephalic and atraumatic.  Eyes:     General: No scleral icterus.    Pupils: Pupils are equal, round, and reactive to light.  Neck:     Musculoskeletal: Normal range of motion and neck supple.  Cardiovascular:     Rate and Rhythm: Normal rate and regular rhythm.     Heart sounds: Normal heart sounds.  Pulmonary:     Effort: Pulmonary effort is normal. No respiratory distress.     Breath sounds: No wheezing.  Abdominal:     General: Bowel sounds are normal. There is no distension.     Palpations: Abdomen is soft. There is no mass.     Tenderness: There is no abdominal tenderness.  Musculoskeletal: Normal range of motion.        General: No deformity.  Skin:    General: Skin is warm and dry.     Findings: No erythema or rash.  Neurological:     Mental Status: She is alert and oriented to person, place, and time.     Cranial Nerves: No cranial nerve deficit.     Coordination: Coordination normal.  Psychiatric:        Behavior: Behavior normal.        Thought Content: Thought content normal.     CMP Latest Ref Rng & Units 09/23/2019  Glucose 70 - 99 mg/dL 111(H)  BUN 8 - 23 mg/dL 15  Creatinine 0.44 - 1.00 mg/dL 0.87  Sodium 135 - 145 mmol/L 130(L)  Potassium 3.5 - 5.1 mmol/L 3.5  Chloride 98 - 111 mmol/L 94(L)  CO2 22 - 32 mmol/L 27  Calcium 8.9 - 10.3 mg/dL 9.4  Total Protein 6.5 - 8.1 g/dL 6.8  Total Bilirubin 0.3 - 1.2 mg/dL 0.7  Alkaline Phos 38 - 126 U/L 66  AST 15 - 41  U/L 24  ALT 0 - 44 U/L 18   CBC Latest Ref Rng & Units 09/23/2019  WBC 4.0 - 10.5 K/uL 14.0(H)  Hemoglobin 12.0 - 15.0 g/dL 12.5  Hematocrit 36.0 - 46.0 % 36.7  Platelets 150 - 400 K/uL 419(H)    LABORATORY DATA:  I have reviewed the data as listed Lab Results  Component Value Date   WBC  14.0 (H) 09/23/2019   HGB 12.5 09/23/2019   HCT 36.7 09/23/2019   MCV 90.6 09/23/2019   PLT 419 (H) 09/23/2019   Recent Labs    11/04/18 1439 09/23/19 1205  NA 134* 130*  K 3.1* 3.5  CL 94* 94*  CO2 29 27  GLUCOSE 78 111*  BUN 14 15  CREATININE 0.73 0.87  CALCIUM 9.3 9.4  GFRNONAA >60 >60  GFRAA >60 >60  PROT  --  6.8  ALBUMIN  --  3.9  AST  --  24  ALT  --  18  ALKPHOS  --  66  BILITOT  --  0.7   Iron/TIBC/Ferritin/ %Sat    Component Value Date/Time   IRON 77 09/23/2019 1205   TIBC 327 09/23/2019 1205   FERRITIN 94 09/23/2019 1205   IRONPCTSAT 24 09/23/2019 1205      RADIOGRAPHIC STUDIES: I have personally reviewed the radiological images as listed and agreed with the findings in the report. 11/11/2017 chest x-ray showed right lower lobe pneumonia.  Small right effusion. 10/28/2018 CT chest wo IMPRESSION: 1. Sub solid somewhat reticular nodule measuring 1.3 by 0.8 cm in the right upper lobe. Initial follow-up with CT at 6-12 months is recommended to confirm persistence. If persistent, repeat CT is recommended every 2 years until 5 years of stability has been established. This recommendation follows the consensus statement: Guidelines for Management of Incidental Pulmonary Nodules Detected on CT Images: From the Fleischner Society 2017; Radiology 2017; 284:228-243. 2. There a borderline enlarged 1.0 cm in short axis AP window lymph node which is nonspecific. 3. Aortic Atherosclerosis (ICD10-I70.0) and Emphysema (ICD10-J43.9). 4. Coronary atherosclerosis. 5. Old granulomatous disease.6. Bibasilar scarring. No results found.   ASSESSMENT & PLAN:  1. Iron deficiency anemia, unspecified iron deficiency anemia type   2. Leukocytosis, unspecified type   3. Thrombocytosis ([place])   4. Weight loss   5. Thyroid nodule    #Iron deficiency Labs are reviewed and discussed with the patient. He has normalized.  Iron store is stable. No need for additional IV iron at this  point. We have previously referred her to gastroenterology to establish care.  Patient was seen by Dr. Vicente Males on 02/11/2019 and will recommend to proceed with endoscopy for further evaluation.  Patient has not had colonoscopy/EGD done yet.  # abnormal CT chest finding, previously discussed her image finding on tumor board.  05/04/2019 CT images were independently reviewed by me and discussed with patient. She has a stable 1.3 cm opacity in the anterior right upper lobe.  Most likely benign process.  Stable 5 mm pulmonary nodule in the right lower lobe.  Recommend patient to have a follow-up CT scan in May 2021. Will refer patient to establish with lung cancer screening program.  #Thyroid nodules, will need thyroid ultrasound in future. #Persistent chronic leukocytosis and mild thrombocytosis Questionable reactive versus underlying bone marrow process. She has lost weight during the interval. I recommend patient to have a bone marrow biopsy for further evaluation.  The rationale and the side effects of bone marrow biopsy were discussed with patient in  details. Patient does not eat through the stick at this point and she prefers to discuss with family and update me if she changes her mind.   # History of endometrial adenocarcinoma, FIGO grade 1. Need re-establish care with gyn. Discussed with patient.   Orders Placed This Encounter  Procedures   CBC with Differential/Platelet    Standing Status:   Future    Standing Expiration Date:   10/13/2020   Ferritin    Standing Status:   Future    Standing Expiration Date:   10/13/2020   Iron and TIBC    Standing Status:   Future    Standing Expiration Date:   10/13/2020    All questions were answered. The patient knows to call the clinic with any problems questions or concerns.  Return of visit: 3 months.   Earlie Server, MD, PhD Hematology Oncology [facility] Pager- [phone] 10/14/2019

## 2020-01-08 ENCOUNTER — Inpatient Hospital Stay: Payer: Medicare Other

## 2020-01-11 ENCOUNTER — Inpatient Hospital Stay: Payer: Medicare Other | Admitting: Oncology

## 2020-01-11 ENCOUNTER — Inpatient Hospital Stay: Payer: Medicare Other

## 2020-06-14 ENCOUNTER — Other Ambulatory Visit: Payer: Self-pay

## 2020-06-14 ENCOUNTER — Ambulatory Visit
Payer: Medicare Other | Attending: Student in an Organized Health Care Education/Training Program | Admitting: Student in an Organized Health Care Education/Training Program

## 2020-06-14 ENCOUNTER — Encounter: Payer: Self-pay | Admitting: Student in an Organized Health Care Education/Training Program

## 2020-06-14 VITALS — BP 130/62 | HR 101 | Temp 97.5°F | Resp 18 | Ht 62.0 in | Wt 111.0 lb

## 2020-06-14 DIAGNOSIS — Q761 Klippel-Feil syndrome: Secondary | ICD-10-CM | POA: Insufficient documentation

## 2020-06-14 DIAGNOSIS — F1129 Opioid dependence with unspecified opioid-induced disorder: Secondary | ICD-10-CM | POA: Insufficient documentation

## 2020-06-14 DIAGNOSIS — M542 Cervicalgia: Secondary | ICD-10-CM | POA: Insufficient documentation

## 2020-06-14 DIAGNOSIS — M5412 Radiculopathy, cervical region: Secondary | ICD-10-CM | POA: Diagnosis present

## 2020-06-14 DIAGNOSIS — F1199 Opioid use, unspecified with unspecified opioid-induced disorder: Secondary | ICD-10-CM | POA: Insufficient documentation

## 2020-06-14 DIAGNOSIS — F1190 Opioid use, unspecified, uncomplicated: Secondary | ICD-10-CM | POA: Insufficient documentation

## 2020-06-14 NOTE — Progress Notes (Signed)
Patient: Kelsey Long  Service Category: E/M  Provider: Gillis Santa, MD  DOB: 04-Jan-1946  DOS: 06/14/2020  Referring Provider: Brandy Hale, MD  MRN: [id number]  Setting: Ambulatory outpatient  PCP: Alvester Chou, NP  Type: New Patient  Specialty: Interventional Pain Management    Location: Office  Delivery: Face-to-face     Primary Reason(s) for Visit: Encounter for initial evaluation of one or more chronic problems (new to examiner) potentially causing chronic pain, and posing a threat to normal musculoskeletal function. (Level of risk: High) CC: Neck Pain, Back Pain, and Shoulder Pain  HPI  Ms. Kelsey Long is a 74 y.o. year old, female patient, who comes today to see [place] for the first time for an initial evaluation of her chronic pain. She has DOE (dyspnea on exertion); CAP (community acquired pneumonia); Iron deficiency anemia; Cervical radicular pain; Opioid dependence with opioid-induced disorder ([place]); Cervical fusion syndrome (2003); and Opioid use disorder ([place]) on their problem list. Today she comes in for evaluation of her Neck Pain, Back Pain, and Shoulder Pain  Pain Assessment: Location: Left Neck Radiating: radiates into left arm Onset: More than a month ago Duration: Chronic pain Quality: Discomfort Severity: 10-Worst pain ever/10 (subjective, self-reported pain score)  Note: Clear symptom exaggeration. Reported level of pain is not compatible with clinical observations.                         When using our objective Pain Scale, levels between 6 and 10/10 are said to belong in an emergency room, as it progressively worsens from a 6/10, described as severely limiting, requiring emergency care not usually available at an outpatient pain management facility. At a 6/10 level, communication becomes difficult and requires great effort. Assistance to reach the emergency department may be required. Facial flushing and profuse sweating along with potentially dangerous increases in heart  rate and blood pressure will be evident. Effect on ADL: limits activities Timing: Constant Modifying factors: medication, ice and heat BP: 130/62  HR: (!) 101  Onset and Duration: Sudden and Present longer than 3 months Cause of pain: Unknown Severity: Getting worse, NAS-11 at its worse: 10/10 and NAS-11 now: 10/10 Timing: Not influenced by the time of the day Aggravating Factors: Motion Alleviating Factors: Cold packs, Hot packs and Medications Associated Problems: Dizziness and Weakness Quality of Pain: Constant and Uncomfortable Previous Examinations or Tests: CT scan, MRI scan and X-rays Previous Treatments: Epidural steroid injections, Narcotic medications and TENS  The patient comes into the clinics today for the first time for a chronic pain management evaluation.   Ms. Kelsey Long is a 74 year old female who presents [facility] medication management.  Of note patient endorses left neck pain that radiates into her left arm.  This has been present for many years.  She has a history of C5-C7 fusion along with severe degenerative disc disease from C7-T1.  She has been prescribed oxycodone 15 mg 4 times daily as needed by Dr. Brandy Hale, MME equals ninety.  This has been a chronic, analgesic medication for her.  She states that the pain clinic is closing and she would like to have her medication management transferred here.  Of note patient is a very poor historian and has tangential thoughts.  She has mentioned many times during the encounter that she is very concerned of addiction but that she is not addicted to her oxycodone.  She states that she "flushes some of her medications" at  the end of the month because she has so many.  This is inconsistent with what she initially told me that she takes her medications 3-4 times a day as needed.  Furthermore, she lives in a household where someone has taken her oxycodone in the past.  She is not very forthright or  clear with the details regarding this.  Historic Controlled Substance Pharmacotherapy Review  PMP and historical list of controlled substances:  05/20/2020  1   05/02/2020  Oxycodone Hcl 15 MG Tablet  120.00  [address]   [id number]   [place] ([id number])   0  90.00 MME  Medicare   [place]    Medications: The patient did not bring the medication(s) to the appointment, as requested in our "New Patient Package" Pharmacodynamics: Desired effects: Analgesia: The patient reports <50% benefit. Reported improvement in function: The patient reports medication allows her to accomplish basic ADLs. Clinically meaningful improvement in function (CMIF): Sustained CMIF goals met Perceived effectiveness: Described as relatively effective, allowing for increase in activities of daily living (ADL) Undesirable effects: Side-effects or Adverse reactions: None reported Historical Monitoring: The patient  reports current drug use. Drug: Oxycodone. List of all UDS Test(s): No results found for: MDMA, COCAINSCRNUR, [place], [place], CANNABQUANT, THCU, [place] List of other Serum/Urine Drug Screening Test(s):  No results found for: AMPHSCRSER, BARBSCRSER, BENZOSCRSER, COCAINSCRSER, COCAINSCRNUR, PCPSCRSER, PCPQUANT, THCSCRSER, THCU, CANNABQUANT, OPIATESCRSER, OXYSCRSER, PROPOXSCRSER, ETH Historical Background Evaluation: [place] PMP: PDMP reviewed during this encounter. Six (6) year initial data search conducted.             [place] Department of public safety, offender search: Editor, commissioning Information) Non-contributory Risk Assessment Profile: Aberrant behavior: aggressive complaining about need for higher doses or stronger medication, claims that "nothing else works" and resistance to changing therapy Risk factors for fatal opioid overdose: None identified today Fatal overdose hazard ratio (HR): Calculation deferred Non-fatal overdose hazard ratio (HR): 3.73 for 50-99 MME/day Risk of opioid abuse or dependence: 0.7-3.0% with doses ? 36  MME/day and 6.1-26% with doses ? 120 MME/day. Substance use disorder (SUD) risk level: High Personal History of Substance Abuse (SUD-Substance use disorder):  Alcohol: Negative  Illegal Drugs: Negative  Rx Drugs: Negative  ORT Risk Level calculation: Low Risk (inaccurate as patient not honest about family and personal history of substance abuse)  Opioid Risk Tool - 06/14/20 1440      Family History of Substance Abuse   Alcohol Negative    Illegal Drugs Negative    Rx Drugs Negative      Personal History of Substance Abuse   Alcohol Negative    Illegal Drugs Negative    Rx Drugs Negative      Age   Age between 57-45 years  No      History of Preadolescent Sexual Abuse   History of Preadolescent Sexual Abuse Negative or Female      Psychological Disease   Psychological Disease Negative    Depression Positive      Total Score   Opioid Risk Tool Scoring 1    Opioid Risk Interpretation Low Risk          ORT Scoring interpretation table:  Score <3 = Low Risk for SUD  Score between 4-7 = Moderate Risk for SUD  Score >8 = High Risk for Opioid Abuse   PHQ-2 Depression Scale:  Total score: 0  PHQ-2 Scoring interpretation table: (Score and probability of major depressive disorder)  Score 0 = No depression  Score 1 = 15.4%  Probability  Score 2 = 21.1% Probability  Score 3 = 38.4% Probability  Score 4 = 45.5% Probability  Score 5 = 56.4% Probability  Score 6 = 78.6% Probability   PHQ-9 Depression Scale:  Total score: 0  PHQ-9 Scoring interpretation table:  Score 0-4 = No depression  Score 5-9 = Mild depression  Score 10-14 = Moderate depression  Score 15-19 = Moderately severe depression  Score 20-27 = Severe depression (2.4 times higher risk of SUD and 2.89 times higher risk of overuse)   Pharmacologic Plan: Non-opioid analgesic therapy offered.            Initial impression: High risk for opiate therapy.,  Poor candidate for opioid analgesics  Meds   Current  Outpatient Medications:  .  albuterol (PROVENTIL HFA;VENTOLIN HFA) 108 (90 BASE) MCG/ACT inhaler, Inhale 2 puffs into the lungs every 6 (six) hours as needed for wheezing or shortness of breath. , Disp: , Rfl:  .  albuterol (PROVENTIL) (2.5 MG/3ML) 0.083% nebulizer solution, Take 3 mLs (2.5 mg total) by nebulization every 6 (six) hours as needed for wheezing or shortness of breath., Disp: 75 mL, Rfl: 2 .  atorvastatin (LIPITOR) 20 MG tablet, Take 20 mg by mouth daily., Disp: , Rfl: 5 .  benazepril-hydrochlorthiazide (LOTENSIN HCT) 20-12.5 MG per tablet, Take 1 tablet by mouth daily., Disp: , Rfl:  .  budesonide-formoterol (SYMBICORT) 160-4.5 MCG/ACT inhaler, Inhale 2 puffs into the lungs 2 (two) times daily., Disp: , Rfl:  .  Cholecalciferol (VITAMIN D3) 1.25 MG (50000 UT) CAPS, Take 1 capsule by mouth once a week., Disp: , Rfl:  .  DULoxetine (CYMBALTA) 60 MG capsule, Take 60 mg by mouth daily., Disp: , Rfl:  .  gabapentin (NEURONTIN) 600 MG tablet, Take 600 mg by mouth 2 (two) times daily. , Disp: , Rfl:  .  meloxicam (MOBIC) 15 MG tablet, Take 15 mg by mouth daily., Disp: , Rfl:  .  metoprolol succinate (TOPROL-XL) 25 MG 24 hr tablet, Take 25 mg by mouth daily., Disp: , Rfl:  .  montelukast (SINGULAIR) 10 MG tablet, Take 10 mg by mouth at bedtime. , Disp: , Rfl:  .  NARCAN 4 MG/0.1ML LIQD nasal spray kit, 4 mg., Disp: , Rfl: 0 .  oxybutynin (DITROPAN-XL) 5 MG 24 hr tablet, Take 2.5 mg 2 (two) times daily by mouth., Disp: , Rfl:  .  oxyCODONE (ROXICODONE) 15 MG immediate release tablet, Take 1 tablet every 6 (six) hours as needed by mouth., Disp: , Rfl:  .  potassium chloride (K-DUR) 10 MEQ tablet, Take 10 mEq by mouth daily., Disp: , Rfl:  .  tiotropium (SPIRIVA) 18 MCG inhalation capsule, Place 18 mcg into inhaler and inhale daily., Disp: , Rfl:  .  metoprolol succinate (TOPROL-XL) 50 MG 24 hr tablet, Take 50 mg by mouth daily. Take with or immediately following a meal. (Patient not taking:  Reported on 06/14/2020), Disp: , Rfl:  .  MOVANTIK 25 MG TABS tablet, Take 1 tablet daily by mouth. (Patient not taking: Reported on 06/14/2020), Disp: , Rfl:   Imaging Review    Narrative CLINICAL DATA:  Posttraumatic headache after fall.  EXAM: CT HEAD WITHOUT CONTRAST  CT CERVICAL SPINE WITHOUT CONTRAST  TECHNIQUE: Multidetector CT imaging of the head and cervical spine was performed following the standard protocol without intravenous contrast. Multiplanar CT image reconstructions of the cervical spine were also generated.  COMPARISON:  CT scan of March 12, 2008.  FINDINGS: CT HEAD FINDINGS  Brain:  Mild chronic ischemic white matter disease. No mass effect or midline shift is noted. Ventricular size is within normal limits. There is no evidence of mass lesion, hemorrhage or acute infarction.  Vascular: No hyperdense vessel or unexpected calcification.  Skull: Normal. Negative for fracture or focal lesion.  Sinuses/Orbits: No acute finding.  Other: None.  CT CERVICAL SPINE FINDINGS  Alignment: Minimal grade 1 anterolisthesis of C3-4 and C7-T1 is noted secondary to posterior facet joint hypertrophy.  Skull base and vertebrae: No acute fracture. No primary bone lesion or focal pathologic process.  Soft tissues and spinal canal: No prevertebral fluid or swelling. No visible canal hematoma.  Disc levels: Status post surgical anterior fusion of C5-6 and C6-7. Severe degenerative disc disease is noted at C7-T1.  Upper chest: Negative.  Other: Extensive degenerative changes are noted involving the left-sided posterior facet joints.  IMPRESSION: Mild chronic ischemic white matter disease. No acute intracranial abnormality seen.  Postsurgical and degenerative changes are noted in the cervical spine. No acute abnormality is noted.   Electronically Signed By: Marijo Conception, M.D. On: 11/04/2018 15:18  Narrative Clinical data:  C5-C6 and C6-C7 spondylosis.  ACDF.  INTRAOPERATIVE CERVICAL SPINE 2 VIEW 06/18/2007:  Comparison:  Preoperative cervical spine x-rays obtained earlier today.  Findings:  Four spot images from the C-arm fluoroscopic device submitted for interpretation post-operatively demonstrate ACDF with hardware from C5 through C7. Bone fusion plugs are appropriately positioned anteriorly in the disc spaces at C5-C6 and C6-C7. Hardware intact. Alignment anatomic.  IMPRESSION: Anatomic alignment status post ACDF with hardware C5 through C7.  Provider: Mila Palmer     Lumbosacral Imaging: Lumbar MR wo contrast: Results for orders placed during the hospital encounter of 05/20/08  MR Lumbar Spine Wo Contrast  Narrative Clinical Data: Back and bilateral leg pain.  MRI LUMBAR SPINE WITHOUT CONTRAST  Technique:  Multiplanar and multiecho pulse sequences of the lumbar spine were obtained without intravenous contrast.  Comparison: Plain films lumbar spine 01/06/2008  Findings: Severe convex right scoliosis with the apex at L2-3 is seen on plain film is noted.  There is mild retrolisthesis of L3 L4 of approximately 0.3 cm.  Vertebral body alignment otherwise unremarkable.  Vertebral body height is maintained. There is no suspicious marrow lesion with discogenic marrow signal change seen at L3-4.  Conus medullaris is normal signal and position.  T10-11, T11-12:  Negative.  T12-L1:  Bilateral facet degenerative change, left worse than right is noted.  Central canal foramina are open.  L1-2:  Left worse than right facet arthropathy with mild disc bulging.  Central canal mildly narrowed with mild narrowing also seen the left lateral recess.  Foramina are open.  L2-3:  Mild broad-based disc bulge and facet degenerative disease which appears mild to moderate.  Central canal is open with mild narrowing the left lateral recess.  Foramina are open.  L3-4:  There is a mild broad-based disc bulge with facet degenerative  change.  Central canal mildly narrowed with mild narrowing the right lateral recess.  Foramina are open.  L4-5:  There is mild disc bulge with right worse than left facet arthropathy.  Central canal foramina remain open.  L5-S1:  Patient has a central left-sided disc protrusion.  Disc indents the thecal sac results in narrowing the lateral recesses, worse on the left but there is facet degenerative change.  IMPRESSION: 1.  Marked convex right scoliosis with the apex L2-3:  There is associated advanced multilevel facet degenerative disease. 2.  Central and  left-sided disc protrusion at L5, S1 indents the thecal sac results in narrowing lateral recesses, worse on the left. 3.  Mild multilevel disc bulging as detailed above.  Provider: Gwendel Hanson   Narrative Clinical Data: Low back pain extending into hips. LUMBAR SPINE - 4 VIEW: Comparison: None. Findings: There is advanced multilevel degenerative disk disease plus rotoscoliosis to the right. There is advanced facet arthropathy. There is vacuum phenomenon at almost every disk level. There is loss of the normal lordotic curve. No acute findings  Impression 1. Advanced, multilevel degenerative disk disease with significant rotoscoliosis. 2. No definite acute finding.  Provider: Rayna Sexton          Narrative Clinical Data: Pain. No injury. RIGHT HAND - 3 VIEW: Findings: There are severe degenerative arthritic changes associated with the first carpal-metacarpal joint. There are no erosive changes. There is no paraarticular osteoporosis or soft tissue calcification. There is no evidence for an occult fracture.  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  Provider: Sharyl Nimrod   Complexity Note: Imaging results reviewed. Results shared with Ms. Grandville Silos, using Layman's terms.                         ROS  Cardiovascular: High blood pressure Pulmonary or Respiratory: Lung  problems and Smoking Neurological: No reported neurological signs or symptoms such as seizures, abnormal skin sensations, urinary and/or fecal incontinence, being born with an abnormal open spine and/or a tethered spinal cord Psychological-Psychiatric: Depressed Gastrointestinal: Irregular, infrequent bowel movements (Constipation) Genitourinary: No reported renal or genitourinary signs or symptoms such as difficulty voiding or producing urine, peeing blood, non-functioning kidney, kidney stones, difficulty emptying the bladder, difficulty controlling the flow of urine, or chronic kidney disease Hematological: Brusing easily Endocrine: No reported endocrine signs or symptoms such as high or low blood sugar, rapid heart rate due to high thyroid levels, obesity or weight gain due to slow thyroid or thyroid disease Rheumatologic: Joint aches and or swelling due to excess weight (Osteoarthritis) Musculoskeletal: Negative for myasthenia gravis, muscular dystrophy, multiple sclerosis or malignant hyperthermia Work History: Retired  Allergies  Ms. Jacquez is allergic to penicillins, shellfish allergy, sulfa drugs cross reactors, tetracyclines & related, and tramadol.  Laboratory Chemistry Profile   Renal Lab Results  Component Value Date   BUN 15 09/23/2019   CREATININE 0.87 09/23/2019   GFRAA >60 09/23/2019   GFRNONAA >60 09/23/2019   SPECGRAV 1.009 02/11/2019   PHUR 6.5 02/11/2019   PROTEINUR Negative 02/11/2019     Electrolytes Lab Results  Component Value Date   NA 130 (L) 09/23/2019   K 3.5 09/23/2019   CL 94 (L) 09/23/2019   CALCIUM 9.4 09/23/2019     Hepatic Lab Results  Component Value Date   AST 24 09/23/2019   ALT 18 09/23/2019   ALBUMIN 3.9 09/23/2019   ALKPHOS 66 09/23/2019     ID Lab Results  Component Value Date   HIV Non Reactive 11/11/2017     Bone No results found for: VD25OH, ZY606TK1SWF, UX3235TD3, UK0254YH0, 25OHVITD1, 25OHVITD2, 25OHVITD3, TESTOFREE,  TESTOSTERONE   Endocrine Lab Results  Component Value Date   GLUCOSE 111 (H) 09/23/2019   GLUCOSEU Negative 02/11/2019   TSH 0.571 02/11/2019     Neuropathy Lab Results  Component Value Date   VITAMINB12 571 02/11/2019   FOLATE 10.2 02/11/2019   HIV Non Reactive 11/11/2017     CNS No results found for: COLORCSF, APPEARCSF, RBCCOUNTCSF, WBCCSF, POLYSCSF,  LYMPHSCSF, EOSCSF, PROTEINCSF, GLUCCSF, JCVIRUS, CSFOLI, IGGCSF, LABACHR, ACETBL, LABACHR, ACETBL   Inflammation (CRP: Acute  ESR: Chronic) Lab Results  Component Value Date   LATICACIDVEN 1.9 11/11/2017     Rheumatology No results found for: RF, ANA, LABURIC, URICUR, LYMEIGGIGMAB, LYMEABIGMQN, HLAB27   Coagulation Lab Results  Component Value Date   INR 1.14 11/11/2017   LABPROT 14.5 11/11/2017   APTT 40 (H) 11/11/2017   PLT 419 (H) 09/23/2019     Cardiovascular Lab Results  Component Value Date   TROPONINI <0.03 11/11/2017   HGB 12.5 09/23/2019   HCT 36.7 09/23/2019     Screening Lab Results  Component Value Date   HIV Non Reactive 11/11/2017     Cancer No results found for: CEA, CA125, LABCA2   Allergens No results found for: ALMOND, APPLE, ASPARAGUS, AVOCADO, BANANA, BARLEY, BASIL, BAYLEAF, GREENBEAN, LIMABEAN, WHITEBEAN, BEEFIGE, REDBEET, BLUEBERRY, BROCCOLI, CABBAGE, MELON, CARROT, CASEIN, CASHEWNUT, CAULIFLOWER, CELERY     Note: Lab results reviewed.   [place]  Drug: Ms. Renner  reports current drug use. Drug: Oxycodone. Alcohol:  reports current alcohol use of about 7.0 standard drinks of alcohol per week. Tobacco:  reports that she quit smoking about 10 years ago. She has a 40.00 pack-year smoking history. She has never used smokeless tobacco. Medical:  has a past medical history of Anxiety, Chronic pain, COPD (chronic obstructive pulmonary disease) ([place]), Hypertension, and Uterine cancer ([place]). Family: family history includes Asthma in an other family member; Cancer in her mother; Emphysema in  her maternal uncle and mother; Heart attack in her mother; Heart failure in her father and maternal grandmother.  Past Surgical History:  Procedure Laterality Date  . BILATERAL CARPAL TUNNEL RELEASE    . GALLBLADDER SURGERY    . NECK SURGERY    . TOTAL ABDOMINAL HYSTERECTOMY     Active Ambulatory Problems    Diagnosis Date Noted  . DOE (dyspnea on exertion) 09/24/2012  . CAP (community acquired pneumonia) 11/11/2017  . Iron deficiency anemia 10/11/2018  . Cervical radicular pain 06/14/2020  . Opioid dependence with opioid-induced disorder ([place]) 06/14/2020  . Cervical fusion syndrome (2003) 06/14/2020  . Opioid use disorder ([place]) 06/14/2020   Resolved Ambulatory Problems    Diagnosis Date Noted  . No Resolved Ambulatory Problems   Past Medical History:  Diagnosis Date  . Anxiety   . Chronic pain   . COPD (chronic obstructive pulmonary disease) ([place])   . Hypertension   . Uterine cancer ([place])    Constitutional Exam  General appearance: Well nourished, well developed, and well hydrated. In no apparent acute distress Vitals:   06/14/20 1419  BP: 130/62  Pulse: (!) 101  Resp: 18  Temp: (!) 97.5 F (36.4 C)  SpO2: 98%  Weight: 111 lb (50.3 kg)  Height: 5' 2"  (1.575 m)   BMI Assessment: Estimated body mass index is 20.3 kg/m as calculated from the following:   Height as of this encounter: 5' 2"  (1.575 m).   Weight as of this encounter: 111 lb (50.3 kg).  BMI interpretation table: BMI level Category Range association with higher incidence of chronic pain  <18 kg/m2 Underweight   18.5-24.9 kg/m2 Ideal body weight   25-29.9 kg/m2 Overweight Increased incidence by 20%  30-34.9 kg/m2 Obese (Class I) Increased incidence by 68%  35-39.9 kg/m2 Severe obesity (Class II) Increased incidence by 136%  >40 kg/m2 Extreme obesity (Class III) Increased incidence by 254%   Patient's current BMI Ideal Body weight  Body mass index  is 20.3 kg/m. Ideal body weight: 50.1 kg (110 lb 7.2  oz) Adjusted ideal body weight: 50.2 kg (110 lb 10.7 oz)   BMI Readings from Last 4 Encounters:  06/14/20 20.30 kg/m  10/14/19 20.76 kg/m  02/11/19 21.47 kg/m  01/06/19 21.11 kg/m   Wt Readings from Last 4 Encounters:  06/14/20 111 lb (50.3 kg)  10/14/19 113 lb 8 oz (51.5 kg)  02/11/19 117 lb 6.4 oz (53.3 kg)  01/06/19 115 lb 7 oz (52.4 kg)    Psych/Mental status: Alert, oriented x 3 (person, place, & time)       Eyes: PERLA Respiratory: No evidence of acute respiratory distress  Cervical Spine Exam  Skin & Axial Inspection: Well healed scar from previous spine surgery detected Alignment: Symmetrical Functional ROM: Pain restricted ROM, bilaterally left greater than right Stability: No instability detected Muscle Tone/Strength: Functionally intact. No obvious neuro-muscular anomalies detected. Sensory (Neurological): Dermatomal pain pattern left C5-C6 Palpation: No palpable anomalies              Upper Extremity (UE) Exam    Side: Right upper extremity  Side: Left upper extremity  Skin & Extremity Inspection: Skin color, temperature, and hair growth are WNL. No peripheral edema or cyanosis. No masses, redness, swelling, asymmetry, or associated skin lesions. No contractures.  Skin & Extremity Inspection: Skin color, temperature, and hair growth are WNL. No peripheral edema or cyanosis. No masses, redness, swelling, asymmetry, or associated skin lesions. No contractures.  Functional ROM: Unrestricted ROM          Functional ROM: Pain restricted ROM for all joints of upper extremity  Muscle Tone/Strength: Functionally intact. No obvious neuro-muscular anomalies detected.  Muscle Tone/Strength: Functionally intact. No obvious neuro-muscular anomalies detected.  Sensory (Neurological): Unimpaired          Sensory (Neurological): Dermatomal pain pattern          Palpation: No palpable anomalies              Palpation: No palpable anomalies              Provocative Test(s):   Phalen's test: deferred Tinel's test: deferred Apley's scratch test (touch opposite shoulder):  Action 1 (Across chest): deferred Action 2 (Overhead): deferred Action 3 (LB reach): deferred   Provocative Test(s):  Phalen's test: deferred Tinel's test: deferred Apley's scratch test (touch opposite shoulder):  Action 1 (Across chest): Decreased ROM Action 2 (Overhead): Decreased ROM Action 3 (LB reach): Decreased ROM    Thoracic Spine Area Exam  Skin & Axial Inspection: No masses, redness, or swelling Alignment: Symmetrical Functional ROM: Unrestricted ROM Stability: No instability detected Muscle Tone/Strength: Functionally intact. No obvious neuro-muscular anomalies detected. Sensory (Neurological): Musculoskeletal pain pattern   Lumbar Exam  Skin & Axial Inspection: No masses, redness, or swelling Alignment: Symmetrical Functional ROM: Pain restricted ROM       Stability: No instability detected Muscle Tone/Strength: Functionally intact. No obvious neuro-muscular anomalies detected. Sensory (Neurological): Musculoskeletal pain pattern  Provocative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

## 2020-06-14 NOTE — Progress Notes (Signed)
Safety precautions to be maintained throughout the outpatient stay will include: orient to surroundings, keep bed in low position, maintain call bell within reach at all times, provide assistance with transfer out of bed and ambulation.  

## 2020-06-28 ENCOUNTER — Ambulatory Visit: Payer: Medicare Other | Admitting: Student in an Organized Health Care Education/Training Program

## 2020-07-08 IMAGING — CT CT CERVICAL SPINE W/O CM
4 of 7 series · 15 of 33 positions shown, 16 images · non-contrast
Comparison: CT scan of March 12, 2008.

CLINICAL DATA: Posttraumatic headache after fall.

EXAM:
CT HEAD WITHOUT CONTRAST
CT CERVICAL SPINE WITHOUT CONTRAST
TECHNIQUE: Multidetector CT imaging of the head and cervical spine was
performed following the standard protocol without intravenous
contrast. Multiplanar CT image reconstructions of the cervical spine
were also generated.

[Series 2: c spine soft · axial · 0.32mm/px · z∈[-198,-102]mm · 4 of 80 slices shown]
[im 16/80  soft-tissue]
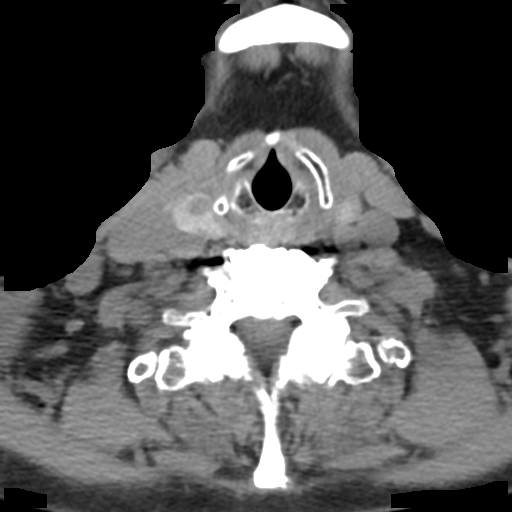
[im 32/80  soft-tissue]
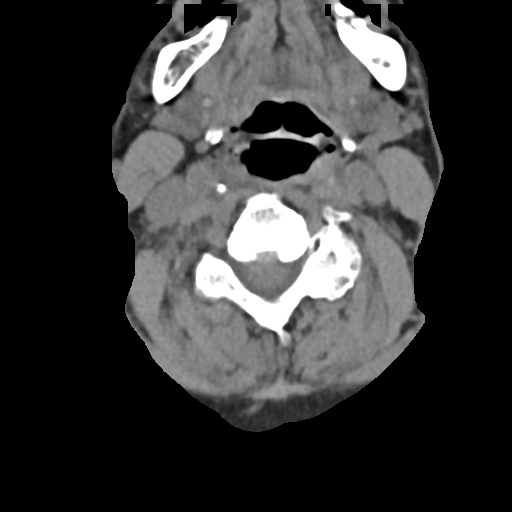
[im 48/80  soft-tissue]
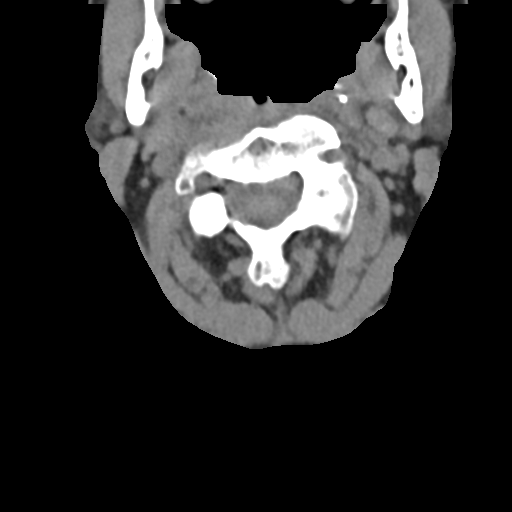
[im 64/80  soft-tissue]
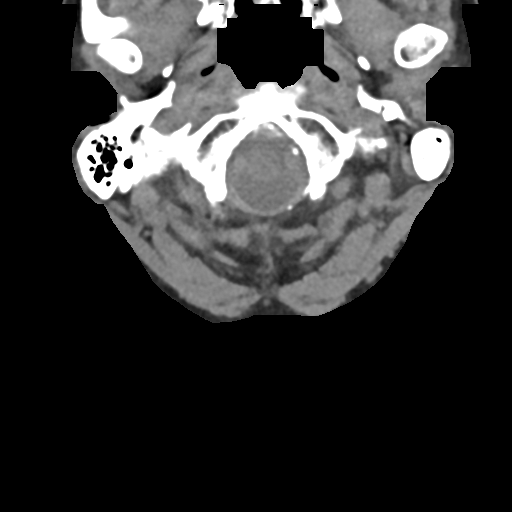

[Series 3: sagittal bone · sagittal · 0.22mm/px · 5 of 51 slices shown]
[im 9/51  bone]
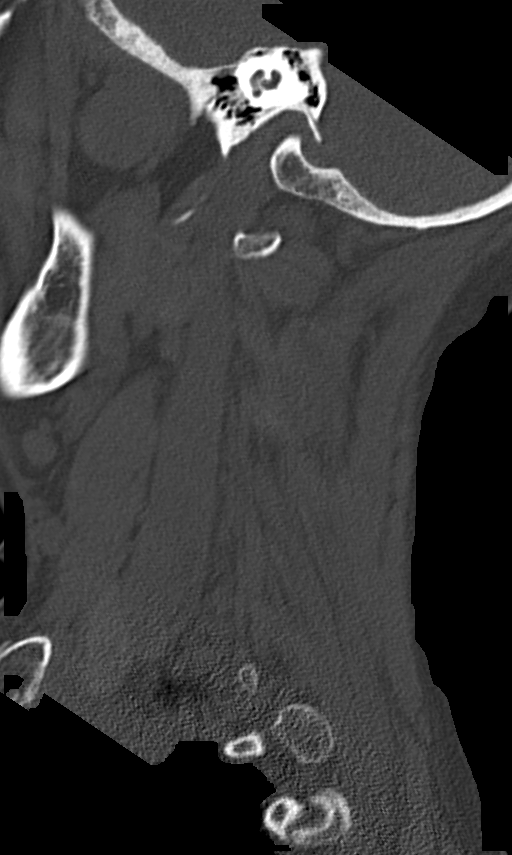
[im 17/51  bone]
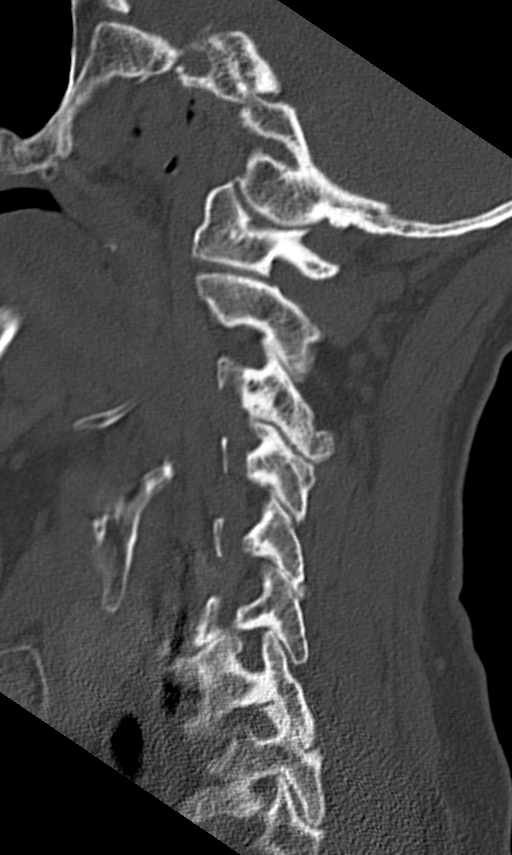
[im 26/51  bone]
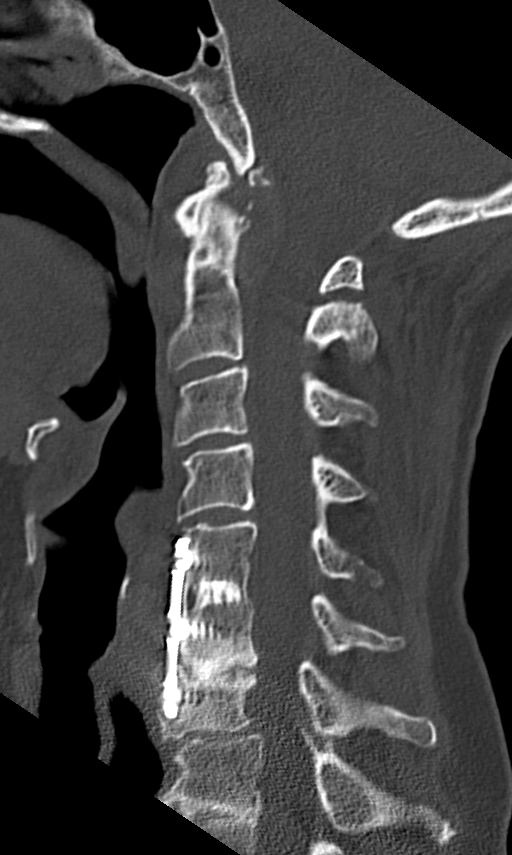
[im 34/51  bone]
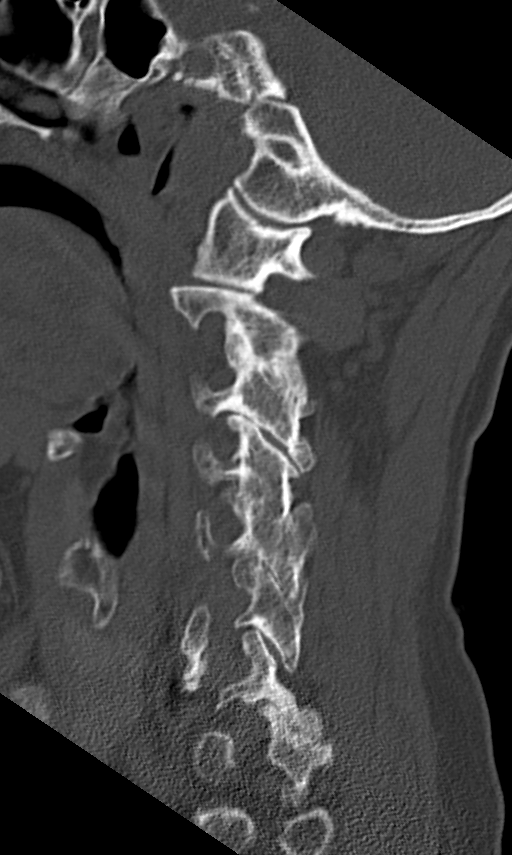
[im 42/51  bone]
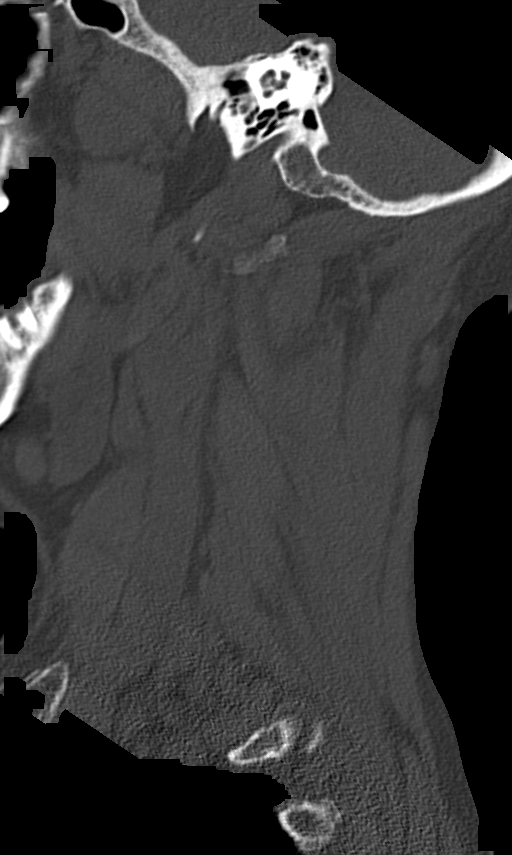

[Series 7: coronal soft tissue · coronal · 0.29mm/px · 2 of 60 slices shown]
[im 20/60  bone]
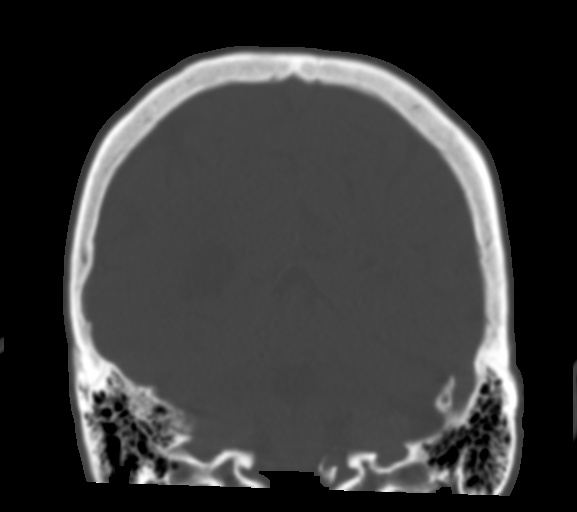
[im 40/60  bone]
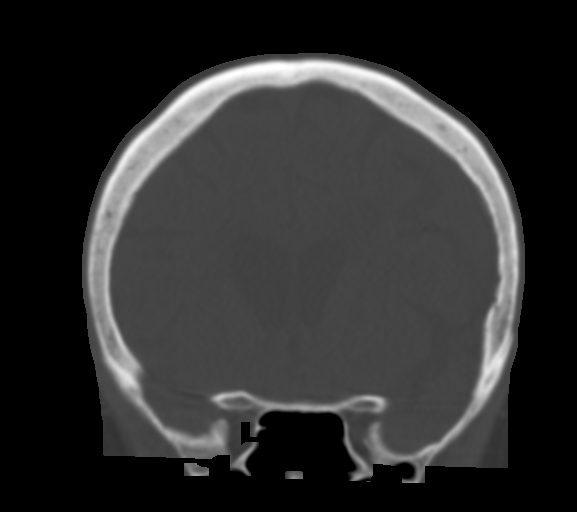

[Series 10: orthogonal bone · axial · 0.20mm/px · z∈[-206,-132]mm · 4 of 75 slices shown, 5 images]
[im 15/75  soft-tissue]
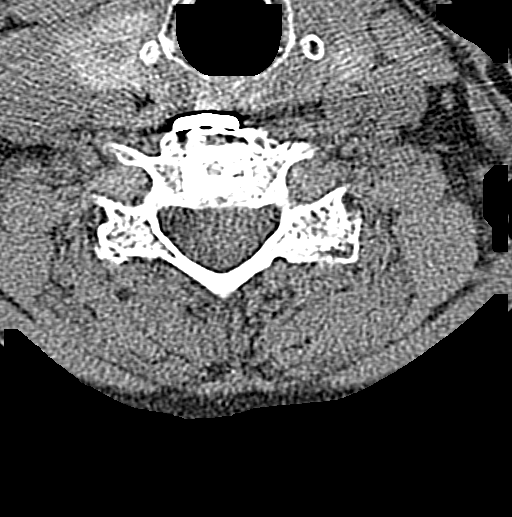
[im 15/75  bone]
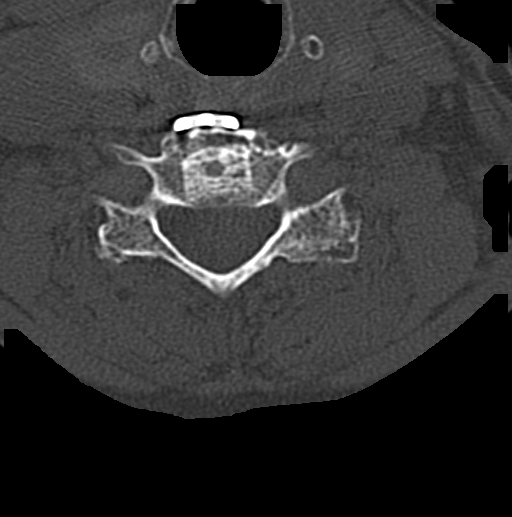
[im 30/75  bone]
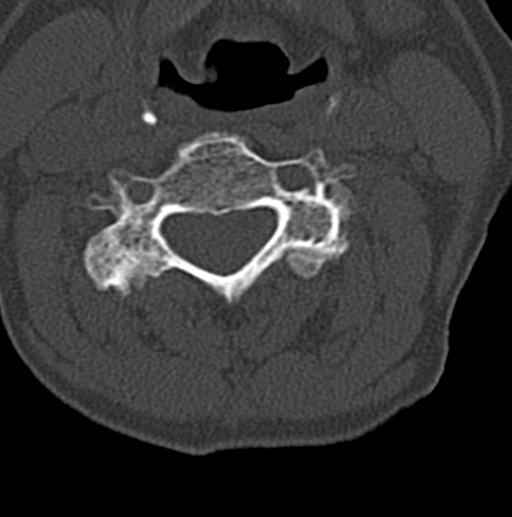
[im 45/75  bone]
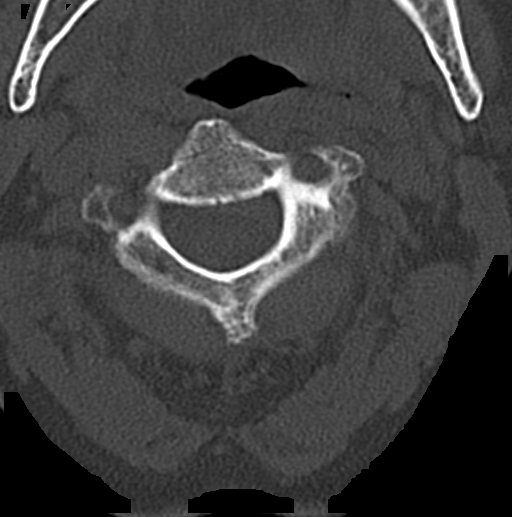
[im 60/75  bone]
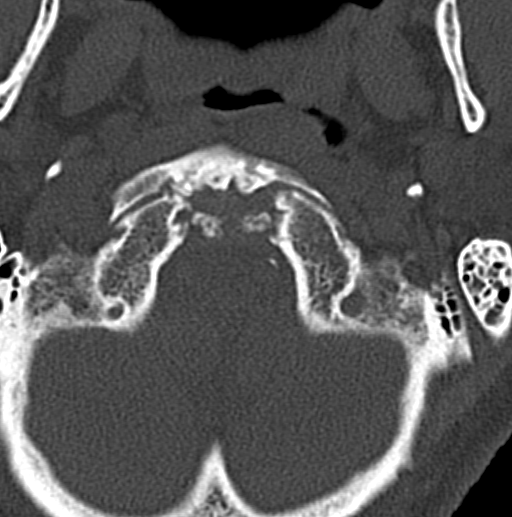

[15 of 33 positions shown; findings below may reference images not displayed]

FINDINGS: CT HEAD FINDINGS

Brain: Mild chronic ischemic white matter disease. No mass effect or
midline shift is noted. Ventricular size is within normal limits.
There is no evidence of mass lesion, hemorrhage or acute infarction.

Vascular: No hyperdense vessel or unexpected calcification.

Skull: Normal. Negative for fracture or focal lesion.

Sinuses/Orbits: No acute finding.

Other: None.

CT CERVICAL SPINE FINDINGS

Alignment: Minimal grade 1 anterolisthesis of C3-4 and C7-T1 is
noted secondary to posterior facet joint hypertrophy.

Skull base and vertebrae: No acute fracture. No primary bone lesion
or focal pathologic process.

Soft tissues and spinal canal: No prevertebral fluid or swelling. No
visible canal hematoma.

Disc levels: Status post surgical anterior fusion of C5-6 and C6-7.
Severe degenerative disc disease is noted at C7-T1.

Upper chest: Negative.

Other: Extensive degenerative changes are noted involving the
left-sided posterior facet joints.
IMPRESSION: Mild chronic ischemic white matter disease. No acute intracranial
abnormality seen.

Postsurgical and degenerative changes are noted in the cervical
spine. No acute abnormality is noted.

## 2020-12-08 ENCOUNTER — Other Ambulatory Visit: Payer: Self-pay | Admitting: Internal Medicine

## 2020-12-08 DIAGNOSIS — Z1231 Encounter for screening mammogram for malignant neoplasm of breast: Secondary | ICD-10-CM

## 2021-02-28 ENCOUNTER — Other Ambulatory Visit: Payer: Self-pay | Admitting: Physical Medicine and Rehabilitation

## 2021-02-28 ENCOUNTER — Ambulatory Visit
Admission: RE | Admit: 2021-02-28 | Discharge: 2021-02-28 | Disposition: A | Discharge status: Home / Self Care | Payer: Medicare Other | Source: Ambulatory Visit | Attending: Physical Medicine and Rehabilitation | Admitting: Physical Medicine and Rehabilitation

## 2021-02-28 ENCOUNTER — Other Ambulatory Visit: Payer: Self-pay

## 2021-02-28 DIAGNOSIS — M542 Cervicalgia: Secondary | ICD-10-CM

## 2021-02-28 DIAGNOSIS — M545 Low back pain, unspecified: Secondary | ICD-10-CM

## 2021-03-27 DIAGNOSIS — J449 Chronic obstructive pulmonary disease, unspecified: Secondary | ICD-10-CM | POA: Diagnosis not present

## 2021-03-28 DIAGNOSIS — M47812 Spondylosis without myelopathy or radiculopathy, cervical region: Secondary | ICD-10-CM | POA: Diagnosis not present

## 2021-03-28 DIAGNOSIS — M47816 Spondylosis without myelopathy or radiculopathy, lumbar region: Secondary | ICD-10-CM | POA: Diagnosis not present

## 2021-03-28 DIAGNOSIS — G894 Chronic pain syndrome: Secondary | ICD-10-CM | POA: Diagnosis not present

## 2021-03-28 DIAGNOSIS — M4125 Other idiopathic scoliosis, thoracolumbar region: Secondary | ICD-10-CM | POA: Diagnosis not present

## 2021-04-17 DIAGNOSIS — R609 Edema, unspecified: Secondary | ICD-10-CM | POA: Diagnosis not present

## 2021-04-17 DIAGNOSIS — G894 Chronic pain syndrome: Secondary | ICD-10-CM | POA: Diagnosis not present

## 2021-04-17 DIAGNOSIS — M069 Rheumatoid arthritis, unspecified: Secondary | ICD-10-CM | POA: Diagnosis not present

## 2021-04-17 DIAGNOSIS — I872 Venous insufficiency (chronic) (peripheral): Secondary | ICD-10-CM | POA: Diagnosis not present

## 2021-04-17 DIAGNOSIS — I10 Essential (primary) hypertension: Secondary | ICD-10-CM | POA: Diagnosis not present

## 2021-04-17 DIAGNOSIS — D485 Neoplasm of uncertain behavior of skin: Secondary | ICD-10-CM | POA: Diagnosis not present

## 2021-04-26 DIAGNOSIS — M47816 Spondylosis without myelopathy or radiculopathy, lumbar region: Secondary | ICD-10-CM | POA: Diagnosis not present

## 2021-04-26 DIAGNOSIS — M4125 Other idiopathic scoliosis, thoracolumbar region: Secondary | ICD-10-CM | POA: Diagnosis not present

## 2021-04-26 DIAGNOSIS — J449 Chronic obstructive pulmonary disease, unspecified: Secondary | ICD-10-CM | POA: Diagnosis not present

## 2021-04-26 DIAGNOSIS — G894 Chronic pain syndrome: Secondary | ICD-10-CM | POA: Diagnosis not present

## 2021-04-26 DIAGNOSIS — M47812 Spondylosis without myelopathy or radiculopathy, cervical region: Secondary | ICD-10-CM | POA: Diagnosis not present

## 2021-05-01 ENCOUNTER — Telehealth (INDEPENDENT_AMBULATORY_CARE_PROVIDER_SITE_OTHER): Payer: Self-pay

## 2021-05-02 NOTE — Telephone Encounter (Signed)
Documentation only.

## 2021-05-05 DIAGNOSIS — J449 Chronic obstructive pulmonary disease, unspecified: Secondary | ICD-10-CM | POA: Diagnosis not present

## 2021-05-27 DIAGNOSIS — J449 Chronic obstructive pulmonary disease, unspecified: Secondary | ICD-10-CM | POA: Diagnosis not present

## 2021-06-16 DIAGNOSIS — J449 Chronic obstructive pulmonary disease, unspecified: Secondary | ICD-10-CM | POA: Diagnosis not present

## 2021-06-26 DIAGNOSIS — J449 Chronic obstructive pulmonary disease, unspecified: Secondary | ICD-10-CM | POA: Diagnosis not present

## 2021-06-27 DIAGNOSIS — G894 Chronic pain syndrome: Secondary | ICD-10-CM | POA: Diagnosis not present

## 2021-06-27 DIAGNOSIS — M4125 Other idiopathic scoliosis, thoracolumbar region: Secondary | ICD-10-CM | POA: Diagnosis not present

## 2021-06-27 DIAGNOSIS — M47812 Spondylosis without myelopathy or radiculopathy, cervical region: Secondary | ICD-10-CM | POA: Diagnosis not present

## 2021-06-27 DIAGNOSIS — M47816 Spondylosis without myelopathy or radiculopathy, lumbar region: Secondary | ICD-10-CM | POA: Diagnosis not present

## 2021-07-24 DIAGNOSIS — M47812 Spondylosis without myelopathy or radiculopathy, cervical region: Secondary | ICD-10-CM | POA: Diagnosis not present

## 2021-07-24 DIAGNOSIS — M47816 Spondylosis without myelopathy or radiculopathy, lumbar region: Secondary | ICD-10-CM | POA: Diagnosis not present

## 2021-07-24 DIAGNOSIS — G894 Chronic pain syndrome: Secondary | ICD-10-CM | POA: Diagnosis not present

## 2021-07-24 DIAGNOSIS — M4125 Other idiopathic scoliosis, thoracolumbar region: Secondary | ICD-10-CM | POA: Diagnosis not present

## 2021-07-27 DIAGNOSIS — J449 Chronic obstructive pulmonary disease, unspecified: Secondary | ICD-10-CM | POA: Diagnosis not present

## 2021-08-27 DIAGNOSIS — J449 Chronic obstructive pulmonary disease, unspecified: Secondary | ICD-10-CM | POA: Diagnosis not present

## 2021-08-31 DIAGNOSIS — M47812 Spondylosis without myelopathy or radiculopathy, cervical region: Secondary | ICD-10-CM | POA: Diagnosis not present

## 2021-08-31 DIAGNOSIS — M47816 Spondylosis without myelopathy or radiculopathy, lumbar region: Secondary | ICD-10-CM | POA: Diagnosis not present

## 2021-08-31 DIAGNOSIS — M65311 Trigger thumb, right thumb: Secondary | ICD-10-CM | POA: Diagnosis not present

## 2021-08-31 DIAGNOSIS — G894 Chronic pain syndrome: Secondary | ICD-10-CM | POA: Diagnosis not present

## 2021-08-31 DIAGNOSIS — M4125 Other idiopathic scoliosis, thoracolumbar region: Secondary | ICD-10-CM | POA: Diagnosis not present

## 2021-09-26 DIAGNOSIS — J449 Chronic obstructive pulmonary disease, unspecified: Secondary | ICD-10-CM | POA: Diagnosis not present

## 2021-10-04 DIAGNOSIS — M47812 Spondylosis without myelopathy or radiculopathy, cervical region: Secondary | ICD-10-CM | POA: Diagnosis not present

## 2021-10-04 DIAGNOSIS — G894 Chronic pain syndrome: Secondary | ICD-10-CM | POA: Diagnosis not present

## 2021-10-04 DIAGNOSIS — M4125 Other idiopathic scoliosis, thoracolumbar region: Secondary | ICD-10-CM | POA: Diagnosis not present

## 2021-10-04 DIAGNOSIS — M47816 Spondylosis without myelopathy or radiculopathy, lumbar region: Secondary | ICD-10-CM | POA: Diagnosis not present

## 2021-10-18 DIAGNOSIS — G3184 Mild cognitive impairment, so stated: Secondary | ICD-10-CM | POA: Diagnosis not present

## 2021-10-18 DIAGNOSIS — R69 Illness, unspecified: Secondary | ICD-10-CM | POA: Diagnosis not present

## 2021-10-18 DIAGNOSIS — N3946 Mixed incontinence: Secondary | ICD-10-CM | POA: Diagnosis not present

## 2021-10-18 DIAGNOSIS — J449 Chronic obstructive pulmonary disease, unspecified: Secondary | ICD-10-CM | POA: Diagnosis not present

## 2021-10-18 DIAGNOSIS — T7631XA Adult psychological abuse, suspected, initial encounter: Secondary | ICD-10-CM | POA: Diagnosis not present

## 2021-10-18 DIAGNOSIS — M069 Rheumatoid arthritis, unspecified: Secondary | ICD-10-CM | POA: Diagnosis not present

## 2021-10-18 DIAGNOSIS — R7303 Prediabetes: Secondary | ICD-10-CM | POA: Diagnosis not present

## 2021-10-18 DIAGNOSIS — E559 Vitamin D deficiency, unspecified: Secondary | ICD-10-CM | POA: Diagnosis not present

## 2021-10-18 DIAGNOSIS — E782 Mixed hyperlipidemia: Secondary | ICD-10-CM | POA: Diagnosis not present

## 2021-10-18 DIAGNOSIS — I10 Essential (primary) hypertension: Secondary | ICD-10-CM | POA: Diagnosis not present

## 2021-10-18 DIAGNOSIS — I739 Peripheral vascular disease, unspecified: Secondary | ICD-10-CM | POA: Diagnosis not present

## 2021-10-18 DIAGNOSIS — K219 Gastro-esophageal reflux disease without esophagitis: Secondary | ICD-10-CM | POA: Diagnosis not present

## 2021-10-27 DIAGNOSIS — J449 Chronic obstructive pulmonary disease, unspecified: Secondary | ICD-10-CM | POA: Diagnosis not present

## 2021-11-01 DIAGNOSIS — G894 Chronic pain syndrome: Secondary | ICD-10-CM | POA: Diagnosis not present

## 2021-11-01 DIAGNOSIS — M47812 Spondylosis without myelopathy or radiculopathy, cervical region: Secondary | ICD-10-CM | POA: Diagnosis not present

## 2021-11-01 DIAGNOSIS — M47816 Spondylosis without myelopathy or radiculopathy, lumbar region: Secondary | ICD-10-CM | POA: Diagnosis not present

## 2021-11-01 DIAGNOSIS — M4125 Other idiopathic scoliosis, thoracolumbar region: Secondary | ICD-10-CM | POA: Diagnosis not present

## 2021-11-26 DIAGNOSIS — J449 Chronic obstructive pulmonary disease, unspecified: Secondary | ICD-10-CM | POA: Diagnosis not present

## 2021-11-30 DIAGNOSIS — M47812 Spondylosis without myelopathy or radiculopathy, cervical region: Secondary | ICD-10-CM | POA: Diagnosis not present

## 2021-11-30 DIAGNOSIS — G894 Chronic pain syndrome: Secondary | ICD-10-CM | POA: Diagnosis not present

## 2021-11-30 DIAGNOSIS — Z79891 Long term (current) use of opiate analgesic: Secondary | ICD-10-CM | POA: Diagnosis not present

## 2021-11-30 DIAGNOSIS — M47816 Spondylosis without myelopathy or radiculopathy, lumbar region: Secondary | ICD-10-CM | POA: Diagnosis not present

## 2021-11-30 DIAGNOSIS — M4125 Other idiopathic scoliosis, thoracolumbar region: Secondary | ICD-10-CM | POA: Diagnosis not present

## 2021-12-27 DIAGNOSIS — J449 Chronic obstructive pulmonary disease, unspecified: Secondary | ICD-10-CM | POA: Diagnosis not present

## 2021-12-28 DIAGNOSIS — M47812 Spondylosis without myelopathy or radiculopathy, cervical region: Secondary | ICD-10-CM | POA: Diagnosis not present

## 2021-12-28 DIAGNOSIS — M47816 Spondylosis without myelopathy or radiculopathy, lumbar region: Secondary | ICD-10-CM | POA: Diagnosis not present

## 2021-12-28 DIAGNOSIS — M4125 Other idiopathic scoliosis, thoracolumbar region: Secondary | ICD-10-CM | POA: Diagnosis not present

## 2021-12-28 DIAGNOSIS — G894 Chronic pain syndrome: Secondary | ICD-10-CM | POA: Diagnosis not present

## 2022-01-30 DIAGNOSIS — M47812 Spondylosis without myelopathy or radiculopathy, cervical region: Secondary | ICD-10-CM | POA: Diagnosis not present

## 2022-01-30 DIAGNOSIS — G894 Chronic pain syndrome: Secondary | ICD-10-CM | POA: Diagnosis not present

## 2022-01-30 DIAGNOSIS — M47816 Spondylosis without myelopathy or radiculopathy, lumbar region: Secondary | ICD-10-CM | POA: Diagnosis not present

## 2022-01-30 DIAGNOSIS — M4125 Other idiopathic scoliosis, thoracolumbar region: Secondary | ICD-10-CM | POA: Diagnosis not present

## 2022-04-03 DIAGNOSIS — Z79891 Long term (current) use of opiate analgesic: Secondary | ICD-10-CM | POA: Diagnosis not present

## 2022-04-03 DIAGNOSIS — G894 Chronic pain syndrome: Secondary | ICD-10-CM | POA: Diagnosis not present

## 2022-04-03 DIAGNOSIS — M47816 Spondylosis without myelopathy or radiculopathy, lumbar region: Secondary | ICD-10-CM | POA: Diagnosis not present

## 2022-04-03 DIAGNOSIS — M47812 Spondylosis without myelopathy or radiculopathy, cervical region: Secondary | ICD-10-CM | POA: Diagnosis not present

## 2022-04-03 DIAGNOSIS — M4125 Other idiopathic scoliosis, thoracolumbar region: Secondary | ICD-10-CM | POA: Diagnosis not present

## 2022-05-02 DIAGNOSIS — G894 Chronic pain syndrome: Secondary | ICD-10-CM | POA: Diagnosis not present

## 2022-05-02 DIAGNOSIS — M4125 Other idiopathic scoliosis, thoracolumbar region: Secondary | ICD-10-CM | POA: Diagnosis not present

## 2022-05-02 DIAGNOSIS — M47812 Spondylosis without myelopathy or radiculopathy, cervical region: Secondary | ICD-10-CM | POA: Diagnosis not present

## 2022-05-02 DIAGNOSIS — M47816 Spondylosis without myelopathy or radiculopathy, lumbar region: Secondary | ICD-10-CM | POA: Diagnosis not present

## 2022-05-30 DIAGNOSIS — M47816 Spondylosis without myelopathy or radiculopathy, lumbar region: Secondary | ICD-10-CM | POA: Diagnosis not present

## 2022-05-30 DIAGNOSIS — M47812 Spondylosis without myelopathy or radiculopathy, cervical region: Secondary | ICD-10-CM | POA: Diagnosis not present

## 2022-05-30 DIAGNOSIS — M4125 Other idiopathic scoliosis, thoracolumbar region: Secondary | ICD-10-CM | POA: Diagnosis not present

## 2022-05-30 DIAGNOSIS — G894 Chronic pain syndrome: Secondary | ICD-10-CM | POA: Diagnosis not present

## 2022-06-20 DIAGNOSIS — D519 Vitamin B12 deficiency anemia, unspecified: Secondary | ICD-10-CM | POA: Diagnosis not present

## 2022-06-20 DIAGNOSIS — G894 Chronic pain syndrome: Secondary | ICD-10-CM | POA: Diagnosis not present

## 2022-06-20 DIAGNOSIS — J449 Chronic obstructive pulmonary disease, unspecified: Secondary | ICD-10-CM | POA: Diagnosis not present

## 2022-06-20 DIAGNOSIS — E559 Vitamin D deficiency, unspecified: Secondary | ICD-10-CM | POA: Diagnosis not present

## 2022-06-20 DIAGNOSIS — E782 Mixed hyperlipidemia: Secondary | ICD-10-CM | POA: Diagnosis not present

## 2022-06-20 DIAGNOSIS — I10 Essential (primary) hypertension: Secondary | ICD-10-CM | POA: Diagnosis not present

## 2022-06-20 DIAGNOSIS — M1990 Unspecified osteoarthritis, unspecified site: Secondary | ICD-10-CM | POA: Diagnosis not present

## 2022-06-20 DIAGNOSIS — R7303 Prediabetes: Secondary | ICD-10-CM | POA: Diagnosis not present

## 2022-06-20 DIAGNOSIS — K219 Gastro-esophageal reflux disease without esophagitis: Secondary | ICD-10-CM | POA: Diagnosis not present

## 2022-06-20 DIAGNOSIS — E538 Deficiency of other specified B group vitamins: Secondary | ICD-10-CM | POA: Diagnosis not present

## 2022-06-22 DIAGNOSIS — I10 Essential (primary) hypertension: Secondary | ICD-10-CM | POA: Diagnosis not present

## 2022-06-22 DIAGNOSIS — E782 Mixed hyperlipidemia: Secondary | ICD-10-CM | POA: Diagnosis not present

## 2022-06-22 DIAGNOSIS — J449 Chronic obstructive pulmonary disease, unspecified: Secondary | ICD-10-CM | POA: Diagnosis not present

## 2022-07-02 DIAGNOSIS — M47812 Spondylosis without myelopathy or radiculopathy, cervical region: Secondary | ICD-10-CM | POA: Diagnosis not present

## 2022-07-02 DIAGNOSIS — G894 Chronic pain syndrome: Secondary | ICD-10-CM | POA: Diagnosis not present

## 2022-07-02 DIAGNOSIS — M47816 Spondylosis without myelopathy or radiculopathy, lumbar region: Secondary | ICD-10-CM | POA: Diagnosis not present

## 2022-07-02 DIAGNOSIS — M4125 Other idiopathic scoliosis, thoracolumbar region: Secondary | ICD-10-CM | POA: Diagnosis not present

## 2022-07-23 DIAGNOSIS — E782 Mixed hyperlipidemia: Secondary | ICD-10-CM | POA: Diagnosis not present

## 2022-07-23 DIAGNOSIS — I10 Essential (primary) hypertension: Secondary | ICD-10-CM | POA: Diagnosis not present

## 2022-07-23 DIAGNOSIS — J449 Chronic obstructive pulmonary disease, unspecified: Secondary | ICD-10-CM | POA: Diagnosis not present

## 2022-10-09 DIAGNOSIS — G894 Chronic pain syndrome: Secondary | ICD-10-CM | POA: Diagnosis not present

## 2022-10-09 DIAGNOSIS — Z79891 Long term (current) use of opiate analgesic: Secondary | ICD-10-CM | POA: Diagnosis not present

## 2022-11-22 DIAGNOSIS — I10 Essential (primary) hypertension: Secondary | ICD-10-CM | POA: Diagnosis not present

## 2022-11-22 DIAGNOSIS — E782 Mixed hyperlipidemia: Secondary | ICD-10-CM | POA: Diagnosis not present

## 2022-12-31 DIAGNOSIS — M4125 Other idiopathic scoliosis, thoracolumbar region: Secondary | ICD-10-CM | POA: Diagnosis not present

## 2022-12-31 DIAGNOSIS — M47812 Spondylosis without myelopathy or radiculopathy, cervical region: Secondary | ICD-10-CM | POA: Diagnosis not present

## 2022-12-31 DIAGNOSIS — G894 Chronic pain syndrome: Secondary | ICD-10-CM | POA: Diagnosis not present

## 2022-12-31 DIAGNOSIS — M47816 Spondylosis without myelopathy or radiculopathy, lumbar region: Secondary | ICD-10-CM | POA: Diagnosis not present

## 2023-02-07 DIAGNOSIS — M47816 Spondylosis without myelopathy or radiculopathy, lumbar region: Secondary | ICD-10-CM | POA: Diagnosis not present

## 2023-02-07 DIAGNOSIS — Z79891 Long term (current) use of opiate analgesic: Secondary | ICD-10-CM | POA: Diagnosis not present

## 2023-02-07 DIAGNOSIS — M47812 Spondylosis without myelopathy or radiculopathy, cervical region: Secondary | ICD-10-CM | POA: Diagnosis not present

## 2023-02-07 DIAGNOSIS — M4125 Other idiopathic scoliosis, thoracolumbar region: Secondary | ICD-10-CM | POA: Diagnosis not present

## 2023-02-07 DIAGNOSIS — G894 Chronic pain syndrome: Secondary | ICD-10-CM | POA: Diagnosis not present

## 2023-03-05 ENCOUNTER — Other Ambulatory Visit: Payer: Self-pay | Admitting: Family

## 2023-03-07 ENCOUNTER — Other Ambulatory Visit: Payer: Self-pay | Admitting: Family

## 2023-03-11 DIAGNOSIS — M47816 Spondylosis without myelopathy or radiculopathy, lumbar region: Secondary | ICD-10-CM | POA: Diagnosis not present

## 2023-03-11 DIAGNOSIS — G894 Chronic pain syndrome: Secondary | ICD-10-CM | POA: Diagnosis not present

## 2023-03-11 DIAGNOSIS — M4125 Other idiopathic scoliosis, thoracolumbar region: Secondary | ICD-10-CM | POA: Diagnosis not present

## 2023-03-11 DIAGNOSIS — M47812 Spondylosis without myelopathy or radiculopathy, cervical region: Secondary | ICD-10-CM | POA: Diagnosis not present

## 2023-04-11 DIAGNOSIS — M4125 Other idiopathic scoliosis, thoracolumbar region: Secondary | ICD-10-CM | POA: Diagnosis not present

## 2023-04-11 DIAGNOSIS — M47812 Spondylosis without myelopathy or radiculopathy, cervical region: Secondary | ICD-10-CM | POA: Diagnosis not present

## 2023-04-11 DIAGNOSIS — G894 Chronic pain syndrome: Secondary | ICD-10-CM | POA: Diagnosis not present

## 2023-04-11 DIAGNOSIS — M47816 Spondylosis without myelopathy or radiculopathy, lumbar region: Secondary | ICD-10-CM | POA: Diagnosis not present

## 2023-05-13 DIAGNOSIS — M4125 Other idiopathic scoliosis, thoracolumbar region: Secondary | ICD-10-CM | POA: Diagnosis not present

## 2023-05-13 DIAGNOSIS — M47812 Spondylosis without myelopathy or radiculopathy, cervical region: Secondary | ICD-10-CM | POA: Diagnosis not present

## 2023-05-13 DIAGNOSIS — M47816 Spondylosis without myelopathy or radiculopathy, lumbar region: Secondary | ICD-10-CM | POA: Diagnosis not present

## 2023-05-13 DIAGNOSIS — G894 Chronic pain syndrome: Secondary | ICD-10-CM | POA: Diagnosis not present

## 2023-06-01 ENCOUNTER — Other Ambulatory Visit: Payer: Self-pay | Admitting: Family

## 2023-06-02 ENCOUNTER — Other Ambulatory Visit: Payer: Self-pay | Admitting: Family

## 2023-06-13 DIAGNOSIS — G894 Chronic pain syndrome: Secondary | ICD-10-CM | POA: Diagnosis not present

## 2023-06-13 DIAGNOSIS — M4125 Other idiopathic scoliosis, thoracolumbar region: Secondary | ICD-10-CM | POA: Diagnosis not present

## 2023-06-13 DIAGNOSIS — M47816 Spondylosis without myelopathy or radiculopathy, lumbar region: Secondary | ICD-10-CM | POA: Diagnosis not present

## 2023-06-13 DIAGNOSIS — M47812 Spondylosis without myelopathy or radiculopathy, cervical region: Secondary | ICD-10-CM | POA: Diagnosis not present

## 2023-07-04 ENCOUNTER — Encounter: Payer: Self-pay | Admitting: Oncology

## 2023-07-16 ENCOUNTER — Encounter: Payer: Medicare Other | Admitting: Family

## 2023-07-25 ENCOUNTER — Ambulatory Visit: Payer: Medicare Other | Admitting: Family

## 2023-07-30 ENCOUNTER — Encounter: Payer: Medicare Other | Admitting: Family

## 2023-08-22 DIAGNOSIS — M4125 Other idiopathic scoliosis, thoracolumbar region: Secondary | ICD-10-CM | POA: Diagnosis not present

## 2023-08-22 DIAGNOSIS — M47812 Spondylosis without myelopathy or radiculopathy, cervical region: Secondary | ICD-10-CM | POA: Diagnosis not present

## 2023-08-22 DIAGNOSIS — M47816 Spondylosis without myelopathy or radiculopathy, lumbar region: Secondary | ICD-10-CM | POA: Diagnosis not present

## 2023-08-22 DIAGNOSIS — G894 Chronic pain syndrome: Secondary | ICD-10-CM | POA: Diagnosis not present

## 2023-09-02 ENCOUNTER — Other Ambulatory Visit: Payer: Self-pay | Admitting: Family

## 2023-09-02 DIAGNOSIS — G894 Chronic pain syndrome: Secondary | ICD-10-CM

## 2023-09-17 ENCOUNTER — Encounter: Payer: Self-pay | Admitting: Oncology

## 2023-10-01 ENCOUNTER — Ambulatory Visit: Payer: Medicare Other | Attending: Physical Medicine and Rehabilitation | Admitting: Physical Therapy

## 2023-10-01 ENCOUNTER — Telehealth: Payer: Self-pay | Admitting: Physical Therapy

## 2023-10-01 NOTE — Telephone Encounter (Signed)
Called pt to inquire about absence. Pt reported that she is not feeling and that she will be unable to come. PT confirmed next apt time with pt and that apt has been changed to an eval.

## 2023-10-08 ENCOUNTER — Ambulatory Visit: Payer: Medicare Other | Admitting: Physical Therapy

## 2023-10-10 ENCOUNTER — Encounter: Payer: Medicare Other | Admitting: Physical Therapy

## 2023-10-10 ENCOUNTER — Ambulatory Visit: Payer: Medicare Other | Admitting: Physical Therapy

## 2023-10-16 ENCOUNTER — Encounter: Payer: Medicare Other | Admitting: Physical Therapy

## 2023-10-23 ENCOUNTER — Encounter: Payer: Medicare Other | Admitting: Physical Therapy

## 2023-10-23 DIAGNOSIS — Z79891 Long term (current) use of opiate analgesic: Secondary | ICD-10-CM | POA: Diagnosis not present

## 2023-10-23 DIAGNOSIS — M4125 Other idiopathic scoliosis, thoracolumbar region: Secondary | ICD-10-CM | POA: Diagnosis not present

## 2023-10-23 DIAGNOSIS — M47816 Spondylosis without myelopathy or radiculopathy, lumbar region: Secondary | ICD-10-CM | POA: Diagnosis not present

## 2023-10-23 DIAGNOSIS — M47812 Spondylosis without myelopathy or radiculopathy, cervical region: Secondary | ICD-10-CM | POA: Diagnosis not present

## 2023-10-23 DIAGNOSIS — G894 Chronic pain syndrome: Secondary | ICD-10-CM | POA: Diagnosis not present

## 2023-10-29 ENCOUNTER — Encounter: Payer: Medicare Other | Admitting: Physical Therapy

## 2023-11-05 ENCOUNTER — Encounter: Payer: Medicare Other | Admitting: Physical Therapy

## 2023-11-07 ENCOUNTER — Encounter: Payer: Medicare Other | Admitting: Physical Therapy

## 2023-11-12 ENCOUNTER — Encounter: Payer: Medicare Other | Admitting: Physical Therapy

## 2023-11-14 ENCOUNTER — Other Ambulatory Visit: Payer: Self-pay | Admitting: Family

## 2023-11-14 ENCOUNTER — Encounter: Payer: Medicare Other | Admitting: Physical Therapy

## 2023-11-19 ENCOUNTER — Encounter: Payer: Medicare Other | Admitting: Physical Therapy

## 2023-11-20 DIAGNOSIS — M47816 Spondylosis without myelopathy or radiculopathy, lumbar region: Secondary | ICD-10-CM | POA: Diagnosis not present

## 2023-11-20 DIAGNOSIS — M47812 Spondylosis without myelopathy or radiculopathy, cervical region: Secondary | ICD-10-CM | POA: Diagnosis not present

## 2023-11-20 DIAGNOSIS — M4125 Other idiopathic scoliosis, thoracolumbar region: Secondary | ICD-10-CM | POA: Diagnosis not present

## 2023-11-20 DIAGNOSIS — G894 Chronic pain syndrome: Secondary | ICD-10-CM | POA: Diagnosis not present

## 2023-11-25 ENCOUNTER — Encounter: Payer: Medicare Other | Admitting: Physical Therapy

## 2023-12-26 ENCOUNTER — Ambulatory Visit: Payer: Medicare Other | Admitting: Family

## 2024-01-03 ENCOUNTER — Ambulatory Visit: Payer: Medicare Other | Admitting: Family

## 2024-01-03 ENCOUNTER — Encounter: Payer: Self-pay | Admitting: Oncology

## 2024-01-22 DIAGNOSIS — M4125 Other idiopathic scoliosis, thoracolumbar region: Secondary | ICD-10-CM | POA: Diagnosis not present

## 2024-01-22 DIAGNOSIS — M47816 Spondylosis without myelopathy or radiculopathy, lumbar region: Secondary | ICD-10-CM | POA: Diagnosis not present

## 2024-01-22 DIAGNOSIS — M47812 Spondylosis without myelopathy or radiculopathy, cervical region: Secondary | ICD-10-CM | POA: Diagnosis not present

## 2024-01-22 DIAGNOSIS — G894 Chronic pain syndrome: Secondary | ICD-10-CM | POA: Diagnosis not present

## 2024-01-29 ENCOUNTER — Encounter: Payer: Self-pay | Admitting: Oncology

## 2024-02-03 ENCOUNTER — Ambulatory Visit: Payer: Medicare Other | Admitting: Internal Medicine

## 2024-02-03 ENCOUNTER — Encounter: Payer: Self-pay | Admitting: Oncology

## 2024-02-24 DIAGNOSIS — M47816 Spondylosis without myelopathy or radiculopathy, lumbar region: Secondary | ICD-10-CM | POA: Diagnosis not present

## 2024-02-24 DIAGNOSIS — M47812 Spondylosis without myelopathy or radiculopathy, cervical region: Secondary | ICD-10-CM | POA: Diagnosis not present

## 2024-02-24 DIAGNOSIS — M4125 Other idiopathic scoliosis, thoracolumbar region: Secondary | ICD-10-CM | POA: Diagnosis not present

## 2024-02-24 DIAGNOSIS — G894 Chronic pain syndrome: Secondary | ICD-10-CM | POA: Diagnosis not present

## 2024-03-26 DIAGNOSIS — G894 Chronic pain syndrome: Secondary | ICD-10-CM | POA: Diagnosis not present

## 2024-03-26 DIAGNOSIS — M47812 Spondylosis without myelopathy or radiculopathy, cervical region: Secondary | ICD-10-CM | POA: Diagnosis not present

## 2024-03-26 DIAGNOSIS — M4125 Other idiopathic scoliosis, thoracolumbar region: Secondary | ICD-10-CM | POA: Diagnosis not present

## 2024-03-26 DIAGNOSIS — M47816 Spondylosis without myelopathy or radiculopathy, lumbar region: Secondary | ICD-10-CM | POA: Diagnosis not present

## 2024-03-28 ENCOUNTER — Other Ambulatory Visit: Payer: Self-pay | Admitting: Family

## 2024-04-29 DIAGNOSIS — M47816 Spondylosis without myelopathy or radiculopathy, lumbar region: Secondary | ICD-10-CM | POA: Diagnosis not present

## 2024-04-29 DIAGNOSIS — Z79891 Long term (current) use of opiate analgesic: Secondary | ICD-10-CM | POA: Diagnosis not present

## 2024-04-29 DIAGNOSIS — G894 Chronic pain syndrome: Secondary | ICD-10-CM | POA: Diagnosis not present

## 2024-04-29 DIAGNOSIS — M4125 Other idiopathic scoliosis, thoracolumbar region: Secondary | ICD-10-CM | POA: Diagnosis not present

## 2024-04-29 DIAGNOSIS — M47812 Spondylosis without myelopathy or radiculopathy, cervical region: Secondary | ICD-10-CM | POA: Diagnosis not present

## 2024-05-01 ENCOUNTER — Other Ambulatory Visit: Payer: Self-pay | Admitting: Family

## 2024-06-01 DIAGNOSIS — M4125 Other idiopathic scoliosis, thoracolumbar region: Secondary | ICD-10-CM | POA: Diagnosis not present

## 2024-06-01 DIAGNOSIS — M47816 Spondylosis without myelopathy or radiculopathy, lumbar region: Secondary | ICD-10-CM | POA: Diagnosis not present

## 2024-06-01 DIAGNOSIS — G894 Chronic pain syndrome: Secondary | ICD-10-CM | POA: Diagnosis not present

## 2024-06-01 DIAGNOSIS — M47812 Spondylosis without myelopathy or radiculopathy, cervical region: Secondary | ICD-10-CM | POA: Diagnosis not present

## 2024-07-02 DIAGNOSIS — G894 Chronic pain syndrome: Secondary | ICD-10-CM | POA: Diagnosis not present

## 2024-07-02 DIAGNOSIS — M47816 Spondylosis without myelopathy or radiculopathy, lumbar region: Secondary | ICD-10-CM | POA: Diagnosis not present

## 2024-07-02 DIAGNOSIS — M47812 Spondylosis without myelopathy or radiculopathy, cervical region: Secondary | ICD-10-CM | POA: Diagnosis not present

## 2024-07-02 DIAGNOSIS — M4125 Other idiopathic scoliosis, thoracolumbar region: Secondary | ICD-10-CM | POA: Diagnosis not present

## 2024-08-04 DIAGNOSIS — M47812 Spondylosis without myelopathy or radiculopathy, cervical region: Secondary | ICD-10-CM | POA: Diagnosis not present

## 2024-08-04 DIAGNOSIS — M47816 Spondylosis without myelopathy or radiculopathy, lumbar region: Secondary | ICD-10-CM | POA: Diagnosis not present

## 2024-08-04 DIAGNOSIS — M4125 Other idiopathic scoliosis, thoracolumbar region: Secondary | ICD-10-CM | POA: Diagnosis not present

## 2024-08-04 DIAGNOSIS — G894 Chronic pain syndrome: Secondary | ICD-10-CM | POA: Diagnosis not present

## 2024-08-31 ENCOUNTER — Other Ambulatory Visit: Payer: Self-pay | Admitting: Family

## 2024-09-01 DIAGNOSIS — M47816 Spondylosis without myelopathy or radiculopathy, lumbar region: Secondary | ICD-10-CM | POA: Diagnosis not present

## 2024-09-01 DIAGNOSIS — G894 Chronic pain syndrome: Secondary | ICD-10-CM | POA: Diagnosis not present

## 2024-09-01 DIAGNOSIS — M4125 Other idiopathic scoliosis, thoracolumbar region: Secondary | ICD-10-CM | POA: Diagnosis not present

## 2024-09-01 DIAGNOSIS — M47812 Spondylosis without myelopathy or radiculopathy, cervical region: Secondary | ICD-10-CM | POA: Diagnosis not present

## 2024-09-03 ENCOUNTER — Other Ambulatory Visit: Payer: Self-pay | Admitting: Family

## 2024-09-20 ENCOUNTER — Emergency Department

## 2024-09-20 ENCOUNTER — Other Ambulatory Visit: Payer: Self-pay

## 2024-09-20 ENCOUNTER — Emergency Department
Admission: EM | Admit: 2024-09-20 | Discharge: 2024-09-20 | Disposition: A | Discharge status: Home / Self Care | Attending: Emergency Medicine | Admitting: Emergency Medicine

## 2024-09-20 DIAGNOSIS — I10 Essential (primary) hypertension: Secondary | ICD-10-CM | POA: Diagnosis not present

## 2024-09-20 DIAGNOSIS — R600 Localized edema: Secondary | ICD-10-CM | POA: Diagnosis not present

## 2024-09-20 DIAGNOSIS — M7989 Other specified soft tissue disorders: Secondary | ICD-10-CM | POA: Diagnosis present

## 2024-09-20 DIAGNOSIS — J449 Chronic obstructive pulmonary disease, unspecified: Secondary | ICD-10-CM | POA: Insufficient documentation

## 2024-09-20 DIAGNOSIS — L03115 Cellulitis of right lower limb: Secondary | ICD-10-CM | POA: Diagnosis not present

## 2024-09-20 DIAGNOSIS — R7989 Other specified abnormal findings of blood chemistry: Secondary | ICD-10-CM | POA: Diagnosis not present

## 2024-09-20 DIAGNOSIS — L03116 Cellulitis of left lower limb: Secondary | ICD-10-CM | POA: Diagnosis not present

## 2024-09-20 DIAGNOSIS — R82998 Other abnormal findings in urine: Secondary | ICD-10-CM | POA: Diagnosis not present

## 2024-09-20 DIAGNOSIS — L03119 Cellulitis of unspecified part of limb: Secondary | ICD-10-CM

## 2024-09-20 LAB — CBC WITH DIFFERENTIAL/PLATELET
Abs Immature Granulocytes: 0.06 K/uL (ref 0.00–0.07)
Basophils Absolute: 0.1 K/uL (ref 0.0–0.1)
Basophils Relative: 1 %
Eosinophils Absolute: 0.4 K/uL (ref 0.0–0.5)
Eosinophils Relative: 3 %
HCT: 38.9 % (ref 36.0–46.0)
Hemoglobin: 12.7 g/dL (ref 12.0–15.0)
Immature Granulocytes: 1 %
Lymphocytes Relative: 26 %
Lymphs Abs: 3.4 K/uL (ref 0.7–4.0)
MCH: 29.8 pg (ref 26.0–34.0)
MCHC: 32.6 g/dL (ref 30.0–36.0)
MCV: 91.3 fL (ref 80.0–100.0)
Monocytes Absolute: 1.4 K/uL — ABNORMAL HIGH (ref 0.1–1.0)
Monocytes Relative: 11 %
Neutro Abs: 7.9 K/uL — ABNORMAL HIGH (ref 1.7–7.7)
Neutrophils Relative %: 58 %
Platelets: 423 K/uL — ABNORMAL HIGH (ref 150–400)
RBC: 4.26 MIL/uL (ref 3.87–5.11)
RDW: 13.4 % (ref 11.5–15.5)
WBC: 13.2 K/uL — ABNORMAL HIGH (ref 4.0–10.5)
nRBC: 0 % (ref 0.0–0.2)

## 2024-09-20 LAB — URINALYSIS, W/ REFLEX TO CULTURE (INFECTION SUSPECTED)
Bilirubin Urine: NEGATIVE
Glucose, UA: NEGATIVE mg/dL
Hgb urine dipstick: NEGATIVE
Ketones, ur: NEGATIVE mg/dL
Nitrite: NEGATIVE
Protein, ur: NEGATIVE mg/dL
Specific Gravity, Urine: 1.015 (ref 1.005–1.030)
pH: 6 (ref 5.0–8.0)

## 2024-09-20 LAB — D-DIMER, QUANTITATIVE: D-Dimer, Quant: 1.61 ug{FEU}/mL — ABNORMAL HIGH (ref 0.00–0.50)

## 2024-09-20 LAB — BASIC METABOLIC PANEL WITH GFR
Anion gap: 8 (ref 5–15)
BUN: 18 mg/dL (ref 8–23)
CO2: 29 mmol/L (ref 22–32)
Calcium: 9 mg/dL (ref 8.9–10.3)
Chloride: 97 mmol/L — ABNORMAL LOW (ref 98–111)
Creatinine, Ser: 0.83 mg/dL (ref 0.44–1.00)
GFR, Estimated: >60 mL/min (ref >60)
Glucose, Bld: 105 mg/dL — ABNORMAL HIGH (ref 70–99)
Potassium: 3.9 mmol/L (ref 3.5–5.1)
Sodium: 134 mmol/L — ABNORMAL LOW (ref 135–145)

## 2024-09-20 LAB — BRAIN NATRIURETIC PEPTIDE: B Natriuretic Peptide: 37.4 pg/mL (ref 0.0–100.0)

## 2024-09-20 MED ORDER — CLINDAMYCIN HCL 150 MG PO CAPS
450.0000 mg | ORAL_CAPSULE | Freq: Once | ORAL | Status: AC
  Administered 2024-09-20: 450 mg via ORAL
  Filled 2024-09-20: qty 3

## 2024-09-20 MED ORDER — CLINDAMYCIN HCL 150 MG PO CAPS: 450.0000 mg | ORAL_CAPSULE | Freq: Three times a day (TID) | ORAL | 0 refills | Status: AC

## 2024-09-20 NOTE — Discharge Instructions (Addendum)
 Please take an over-the-counter probiotic or consume 1 serving of food that contains active cultures including things like yogurt, kefir, or kombucha per day while you are on this antibiotic

## 2024-09-20 NOTE — ED Triage Notes (Signed)
 Pt reports bilateral lower leg swelling and redness that began a week ago. Pt reports it began in her right leg and is now both.

## 2024-09-20 NOTE — ED Provider Notes (Signed)
 [facility] Provider Note   Event Date/Time   First MD Initiated Contact with Patient 09/20/24 2040     (approximate) History  Leg Swelling  HPI TERRIL AMARO is a 78 y.o. female with a past medical history of COPD, hypertension, anxiety, and chronic pain who presents complaining of bilateral lower extremity edema and redness that began 1 week ago and has been worsening since onset.  Patient reports that began in the right leg and is now in bilateral lower extremities from the knee to the ankle.  Patient endorses mild burning pain and itching.  Patient has been using topical moisturizers for this pain and states that she has had flaking skin over this region.  Patient denies any history of heart failure or CKD.  Patient denies any new moisturizers, creams, detergents, or perfumes that her legs have been exposed to ROS: Patient currently denies any vision changes, tinnitus, difficulty speaking, facial droop, sore throat, chest pain, shortness of breath, abdominal pain, nausea/vomiting/diarrhea, dysuria, or weakness/numbness/paresthesias in any extremity   Physical Exam  Triage Vital Signs: ED Triage Vitals  Encounter Vitals Group     BP 09/20/24 1931 138/76     Girls Systolic BP Percentile --      Girls Diastolic BP Percentile --      Boys Systolic BP Percentile --      Boys Diastolic BP Percentile --      Pulse Rate 09/20/24 1931 100     Resp 09/20/24 1931 17     Temp 09/20/24 1931 98 F (36.7 C)     Temp Source 09/20/24 1931 Oral     SpO2 09/20/24 1931 98 %     Weight 09/20/24 1930 130 lb (59 kg)     Height 09/20/24 1930 5' 2 (1.575 m)     Head Circumference --      Peak Flow --      Pain Score 09/20/24 1930 8     Pain Loc --      Pain Education --      Exclude from Growth Chart --    Most recent vital signs: Vitals:   09/20/24 1931 09/20/24 2249  BP: 138/76 (!) 143/79  Pulse: 100 94  Resp: 17 20  Temp: 98 F (36.7 C)   SpO2: 98% 93%    General: Awake, oriented x4. CV:  Good peripheral perfusion. Resp:  Normal effort. Abd:  No distention. Other:  Elderly well-developed, well-nourished Caucasian female resting comfortably in no acute distress.  Erythema and edema circumferentially to bilateral lower extremities from the knee to the ankle without significant tenderness to palpation.  There is warmth ED Results / Procedures / Treatments  Labs (all labs ordered are listed, but only abnormal results are displayed) Labs Reviewed  URINE CULTURE - Abnormal; Notable for the following components:      Result Value   Culture >=100,000 COLONIES/mL KLEBSIELLA PNEUMONIAE (*)    Organism ID, Bacteria KLEBSIELLA PNEUMONIAE (*)    All other components within normal limits  CBC WITH DIFFERENTIAL/PLATELET - Abnormal; Notable for the following components:   WBC 13.2 (*)    Platelets 423 (*)    Neutro Abs 7.9 (*)    Monocytes Absolute 1.4 (*)    All other components within normal limits  BASIC METABOLIC PANEL WITH GFR - Abnormal; Notable for the following components:   Sodium 134 (*)    Chloride 97 (*)    Glucose, Bld 105 (*)    All other  components within normal limits  D-DIMER, QUANTITATIVE - Abnormal; Notable for the following components:   D-Dimer, Quant 1.61 (*)    All other components within normal limits  URINALYSIS, W/ REFLEX TO CULTURE (INFECTION SUSPECTED) - Abnormal; Notable for the following components:   Color, Urine YELLOW (*)    APPearance HAZY (*)    Leukocytes,Ua TRACE (*)    Bacteria, UA MANY (*)    All other components within normal limits  BRAIN NATRIURETIC PEPTIDE   RADIOLOGY ED MD interpretation: Doppler ultrasound of bilateral lower extremities showed no evidence of acute abnormalities other than edema - All radiology independently interpreted and agree with radiology assessment Official radiology report(s): No results found. PROCEDURES: Critical Care performed: No Procedures MEDICATIONS ORDERED IN  ED: Medications  clindamycin  (CLEOCIN ) capsule 450 mg (450 mg Oral Given 09/20/24 2246)   IMPRESSION / MDM / ASSESSMENT AND PLAN / ED COURSE  I reviewed the triage vital signs and the nursing notes.                             The patient is on the cardiac monitor to evaluate for evidence of arrhythmia and/or significant heart rate changes. Patient's presentation is most consistent with acute presentation with potential threat to life or bodily function. Patient is a 78 year old female with the above-stated past medical history who presents complaining of bilateral lower extremity erythema and edema over the past week DDx: DVT, cellulitis, allergic reaction, edema Plan: CBC, BMP, D-dimer, BNP, UA  Given dimer elevation, will proceed with bilateral lower extremity Dopplers  Bilateral venous Dopplers negative for acute DVT.  As patient does have bilateral erythema with a leukocytosis to 13.2, will treat empirically for possible cellulitis.  Patient has multiple allergies and therefore we will have to use clindamycin  in the outpatient setting.  Patient encouraged to use probiotics in the outpatient setting and warned for signs and symptoms of of antibiotic associated diarrhea.  Patient agrees with plan for discharge at this time with PCP follow-up.  Patient given strict return precautions and all questions answered prior to discharge  Dispo: Discharge home with PCP follow-up Clinical Course as of 09/24/24 2118  Thu Sep 24, 2024  2117 US  Venous Img Lower Bilateral [EB]    Clinical Course User Index [EB] Jossie Artist POUR, MD   FINAL CLINICAL IMPRESSION(S) / ED DIAGNOSES   Final diagnoses:  Cellulitis of lower extremity, unspecified laterality  Bilateral lower extremity edema   Rx / DC Orders   ED Discharge Orders          Ordered    clindamycin  (CLEOCIN ) 150 MG capsule  3 times daily        09/20/24 2221           Note:  This document was prepared using Dragon voice recognition  software and may include unintentional dictation errors.   Jossie Artist POUR, MD 09/24/24 2118

## 2024-09-23 LAB — URINE CULTURE: Culture: >=100000 — AB

## 2024-09-24 ENCOUNTER — Telehealth: Payer: Self-pay

## 2024-09-24 ENCOUNTER — Other Ambulatory Visit: Payer: Self-pay | Admitting: Family

## 2024-09-24 DIAGNOSIS — G894 Chronic pain syndrome: Secondary | ICD-10-CM

## 2024-09-24 NOTE — Progress Notes (Signed)
 ED Antimicrobial Stewardship Positive Culture Follow Up   Kelsey Long is an 78 y.o. female who presented to [facility] on 09/20/2024 with a chief complaint of  Chief Complaint  Patient presents with   Leg Swelling    Recent Results (from the past 720 hours)  Urine Culture     Status: Abnormal   Collection Time: 09/20/24  9:50 PM   Specimen: Urine, Random  Result Value Ref Range Status   Specimen Description   Final    URINE, RANDOM Performed at [facility], [address] [place], [place] [address]    Special Requests   Final    NONE Reflexed from [id number] Performed at [facility], [address] [place], [place] [address]    Culture >=100,000 COLONIES/mL KLEBSIELLA PNEUMONIAE (A)  Final   Report Status 09/23/2024 FINAL  Final   Organism ID, Bacteria KLEBSIELLA PNEUMONIAE (A)  Final      Susceptibility   Klebsiella pneumoniae - MIC*    AMPICILLIN RESISTANT Resistant     CEFAZOLIN (URINE) Value in next row Sensitive      2 SENSITIVEThis is a modified FDA-approved test that has been validated and its performance characteristics determined by the reporting laboratory.  This laboratory is certified under the Clinical Laboratory Improvement Amendments CLIA as qualified to perform high complexity clinical laboratory testing.    CEFEPIME  Value in next row Sensitive      2 SENSITIVEThis is a modified FDA-approved test that has been validated and its performance characteristics determined by the reporting laboratory.  This laboratory is certified under the Clinical Laboratory Improvement Amendments CLIA as qualified to perform high complexity clinical laboratory testing.    ERTAPENEM Value in next row Sensitive      2 SENSITIVEThis is a modified FDA-approved test that has been validated and its performance characteristics determined by the reporting laboratory.  This laboratory is certified under the Clinical Laboratory Improvement Amendments CLIA as qualified to  perform high complexity clinical laboratory testing.    CEFTRIAXONE  Value in next row Sensitive      2 SENSITIVEThis is a modified FDA-approved test that has been validated and its performance characteristics determined by the reporting laboratory.  This laboratory is certified under the Clinical Laboratory Improvement Amendments CLIA as qualified to perform high complexity clinical laboratory testing.    CIPROFLOXACIN  Value in next row Sensitive      2 SENSITIVEThis is a modified FDA-approved test that has been validated and its performance characteristics determined by the reporting laboratory.  This laboratory is certified under the Clinical Laboratory Improvement Amendments CLIA as qualified to perform high complexity clinical laboratory testing.    GENTAMICIN Value in next row Sensitive      2 SENSITIVEThis is a modified FDA-approved test that has been validated and its performance characteristics determined by the reporting laboratory.  This laboratory is certified under the Clinical Laboratory Improvement Amendments CLIA as qualified to perform high complexity clinical laboratory testing.    NITROFURANTOIN  Value in next row Intermediate      2 SENSITIVEThis is a modified FDA-approved test that has been validated and its performance characteristics determined by the reporting laboratory.  This laboratory is certified under the Clinical Laboratory Improvement Amendments CLIA as qualified to perform high complexity clinical laboratory testing.    TRIMETH/SULFA Value in next row Sensitive      2 SENSITIVEThis is a modified FDA-approved test that has been validated and its performance characteristics determined by the reporting laboratory.  This  laboratory is certified under the Clinical Laboratory Improvement Amendments CLIA as qualified to perform high complexity clinical laboratory testing.    AMPICILLIN/SULBACTAM Value in next row Sensitive      2 SENSITIVEThis is a modified FDA-approved test that  has been validated and its performance characteristics determined by the reporting laboratory.  This laboratory is certified under the Clinical Laboratory Improvement Amendments CLIA as qualified to perform high complexity clinical laboratory testing.    PIP/TAZO Value in next row Sensitive      <=4 SENSITIVEThis is a modified FDA-approved test that has been validated and its performance characteristics determined by the reporting laboratory.  This laboratory is certified under the Clinical Laboratory Improvement Amendments CLIA as qualified to perform high complexity clinical laboratory testing.    MEROPENEM Value in next row Sensitive      <=4 SENSITIVEThis is a modified FDA-approved test that has been validated and its performance characteristics determined by the reporting laboratory.  This laboratory is certified under the Clinical Laboratory Improvement Amendments CLIA as qualified to perform high complexity clinical laboratory testing.    * >=100,000 COLONIES/mL KLEBSIELLA PNEUMONIAE    [x]  Treated with Clindamycin , organism resistant to prescribed antimicrobial []  Patient discharged originally without antimicrobial agent and treatment is now indicated  Per ED provider, Will call patient to determine if she is having any symptoms of UTI or AMS. If so will send in prescription for cephalexin 500 mg. 1 Tab PO BID for 5 days. If patient is not experiencing symptoms then additional antimicrobial therapy is not recommended.  Called patient and LVM. Will follow-up later today 09/24/24.  ED Provider: [name] 09/24/2024, 12:47 PM Clinical Pharmacist Monday - Friday phone -  [phone] Saturday - Sunday phone - [phone]

## 2024-09-24 NOTE — Telephone Encounter (Signed)
 Called patient to discuss lab result from ED visit on 9/28. LVM

## 2024-09-25 NOTE — Progress Notes (Signed)
 ED Antimicrobial Stewardship Positive Culture Follow Up   Kelsey Long is an 78 y.o. female who presented to [facility] on 09/20/2024 with a chief complaint of  Chief Complaint  Patient presents with   Leg Swelling    Recent Results (from the past 720 hours)  Urine Culture     Status: Abnormal   Collection Time: 09/20/24  9:50 PM   Specimen: Urine, Random  Result Value Ref Range Status   Specimen Description   Final    URINE, RANDOM Performed at [facility], [address] [place], [place] [address]    Special Requests   Final    NONE Reflexed from [phone] Performed at [facility], [address] [place], [place] [address]    Culture >=100,000 COLONIES/mL KLEBSIELLA PNEUMONIAE (A)  Final   Report Status 09/23/2024 FINAL  Final   Organism ID, Bacteria KLEBSIELLA PNEUMONIAE (A)  Final      Susceptibility   Klebsiella pneumoniae - MIC*    AMPICILLIN RESISTANT Resistant     CEFAZOLIN (URINE) Value in next row Sensitive      2 SENSITIVEThis is a modified FDA-approved test that has been validated and its performance characteristics determined by the reporting laboratory.  This laboratory is certified under the Clinical Laboratory Improvement Amendments CLIA as qualified to perform high complexity clinical laboratory testing.    CEFEPIME  Value in next row Sensitive      2 SENSITIVEThis is a modified FDA-approved test that has been validated and its performance characteristics determined by the reporting laboratory.  This laboratory is certified under the Clinical Laboratory Improvement Amendments CLIA as qualified to perform high complexity clinical laboratory testing.    ERTAPENEM Value in next row Sensitive      2 SENSITIVEThis is a modified FDA-approved test that has been validated and its performance characteristics determined by the reporting laboratory.  This laboratory is certified under the Clinical Laboratory Improvement Amendments CLIA as qualified to  perform high complexity clinical laboratory testing.    CEFTRIAXONE  Value in next row Sensitive      2 SENSITIVEThis is a modified FDA-approved test that has been validated and its performance characteristics determined by the reporting laboratory.  This laboratory is certified under the Clinical Laboratory Improvement Amendments CLIA as qualified to perform high complexity clinical laboratory testing.    CIPROFLOXACIN  Value in next row Sensitive      2 SENSITIVEThis is a modified FDA-approved test that has been validated and its performance characteristics determined by the reporting laboratory.  This laboratory is certified under the Clinical Laboratory Improvement Amendments CLIA as qualified to perform high complexity clinical laboratory testing.    GENTAMICIN Value in next row Sensitive      2 SENSITIVEThis is a modified FDA-approved test that has been validated and its performance characteristics determined by the reporting laboratory.  This laboratory is certified under the Clinical Laboratory Improvement Amendments CLIA as qualified to perform high complexity clinical laboratory testing.    NITROFURANTOIN  Value in next row Intermediate      2 SENSITIVEThis is a modified FDA-approved test that has been validated and its performance characteristics determined by the reporting laboratory.  This laboratory is certified under the Clinical Laboratory Improvement Amendments CLIA as qualified to perform high complexity clinical laboratory testing.    TRIMETH/SULFA Value in next row Sensitive      2 SENSITIVEThis is a modified FDA-approved test that has been validated and its performance characteristics determined by the reporting laboratory.  This  laboratory is certified under the Clinical Laboratory Improvement Amendments CLIA as qualified to perform high complexity clinical laboratory testing.    AMPICILLIN/SULBACTAM Value in next row Sensitive      2 SENSITIVEThis is a modified FDA-approved test that  has been validated and its performance characteristics determined by the reporting laboratory.  This laboratory is certified under the Clinical Laboratory Improvement Amendments CLIA as qualified to perform high complexity clinical laboratory testing.    PIP/TAZO Value in next row Sensitive      <=4 SENSITIVEThis is a modified FDA-approved test that has been validated and its performance characteristics determined by the reporting laboratory.  This laboratory is certified under the Clinical Laboratory Improvement Amendments CLIA as qualified to perform high complexity clinical laboratory testing.    MEROPENEM Value in next row Sensitive      <=4 SENSITIVEThis is a modified FDA-approved test that has been validated and its performance characteristics determined by the reporting laboratory.  This laboratory is certified under the Clinical Laboratory Improvement Amendments CLIA as qualified to perform high complexity clinical laboratory testing.    * >=100,000 COLONIES/mL KLEBSIELLA PNEUMONIAE    [x]  Treated with Clindamycin , organism resistant to prescribed antimicrobial []  Patient discharged originally without antimicrobial agent and treatment is now indicated  Per ED provider, Will call patient to determine if she is having any symptoms of UTI or AMS. If so will send in prescription for cephalexin 500 mg. 1 Tab PO BID for 5 days. If patient is not experiencing symptoms then additional antimicrobial therapy is not recommended.  Called patient x2 and son x1 and LVM  and call back number.  ED Provider: [name] 09/25/2024, 11:47 AM Clinical Pharmacist Monday - Friday phone -  [phone] Saturday - Sunday phone - [phone]

## 2024-10-05 ENCOUNTER — Encounter: Payer: Self-pay | Admitting: Oncology

## 2024-10-06 ENCOUNTER — Ambulatory Visit (INDEPENDENT_AMBULATORY_CARE_PROVIDER_SITE_OTHER): Admitting: Cardiology

## 2024-10-06 ENCOUNTER — Encounter: Payer: Self-pay | Admitting: Cardiology

## 2024-10-06 VITALS — BP 146/62 | HR 78 | Ht 62.0 in | Wt 93.8 lb

## 2024-10-06 DIAGNOSIS — L03115 Cellulitis of right lower limb: Secondary | ICD-10-CM | POA: Diagnosis not present

## 2024-10-06 DIAGNOSIS — Z0100 Encounter for examination of eyes and vision without abnormal findings: Secondary | ICD-10-CM

## 2024-10-06 DIAGNOSIS — L03116 Cellulitis of left lower limb: Secondary | ICD-10-CM | POA: Diagnosis not present

## 2024-10-06 DIAGNOSIS — H9190 Unspecified hearing loss, unspecified ear: Secondary | ICD-10-CM | POA: Insufficient documentation

## 2024-10-06 DIAGNOSIS — Z7689 Persons encountering health services in other specified circumstances: Secondary | ICD-10-CM

## 2024-10-06 MED ORDER — CIPROFLOXACIN HCL 500 MG PO TABS: 500.0000 mg | ORAL_TABLET | Freq: Two times a day (BID) | ORAL | 0 refills | Status: AC

## 2024-10-06 NOTE — Progress Notes (Signed)
 Established Patient Office Visit  Subjective:  Patient ID: Kelsey Long, female    DOB: 10/09/1946  Age: 78 y.o. MRN: [id number]  Chief Complaint  Patient presents with   Follow-up    Finished antibiotics after hospital stay for cellulitis. Hospital mentioned further work up for possible CHF. Both legs are swollen and red from feet to legs, has lost a lot of weight.    Patient in office for an acute visit, hospital follow up. Patient went to ED on 09/20/24 with complaints of bilateral lower extremity edema and erythema. D dimer elevated, proceeded with bilateral lower extremity Dopplers, negative for acute DVT.  Leukocytosis 13.2, was treated empirically for possible cellulitis.  Patient has multiple allergies treated with clindamycin  in the outpatient setting.   Patient returns today, continues to have edema of lower legs and erythema. Daughter reports no change since leaving ED.  Urine culture resulted on 09/23/24. Abnormal with klebsiella pneumoniae. Due to multiple allergies, will treat with Cipro .  Patient requested a referral to audiology due to decreased hearing, referral sent. Patient requesting a referral to ophthalmology, referral sent.  Patient requesting a referral to podiatry for nail care, referral sent.     No other concerns at this time.   Past Medical History:  Diagnosis Date   Anxiety    Chronic pain    COPD (chronic obstructive pulmonary disease) (HCC)    Hypertension    Uterine cancer (HCC)     Past Surgical History:  Procedure Laterality Date   BILATERAL CARPAL TUNNEL RELEASE     GALLBLADDER SURGERY     NECK SURGERY     TOTAL ABDOMINAL HYSTERECTOMY      Social History   Socioeconomic History   Marital status: Divorced    Spouse name: Not on file   Number of children: Not on file   Years of education: Not on file   Highest education level: Not on file  Occupational History   Not on file  Tobacco Use   Smoking status: Former    Current  packs/day: 0.00    Average packs/day: 1 pack/day for 40.0 years (40.0 ttl pk-yrs)    Types: Cigarettes    Start date: 12/24/1969    Quit date: 12/24/2009    Years since quitting: 14.7   Smokeless tobacco: Never  Vaping Use   Vaping status: Never Used  Substance and Sexual Activity   Alcohol use: Yes    Alcohol/week: 7.0 standard drinks of alcohol    Types: 7 Cans of beer per week    Comment: 1 beer daily    Drug use: Yes    Types: Oxycodone    Sexual activity: Not on file  Other Topics Concern   Not on file  Social History Narrative   Not on file   Social Drivers of Health   Financial Resource Strain: Not on file  Food Insecurity: Not on file  Transportation Needs: Not on file  Physical Activity: Not on file  Stress: Not on file  Social Connections: Not on file  Intimate Partner Violence: Not on file    Family History  Problem Relation Age of Onset   Emphysema Mother    Cancer Mother    Heart attack Mother    Heart failure Father    Emphysema Maternal Uncle    Asthma Other        daughter   Heart failure Maternal Grandmother     Allergies  Allergen Reactions   Penicillins Swelling  Has patient had a PCN reaction causing immediate rash, facial/tongue/throat swelling, SOB or lightheadedness with hypotension: No Has patient had a PCN reaction causing severe rash involving mucus membranes or skin necrosis: No Has patient had a PCN reaction that required hospitalization: No Has patient had a PCN reaction occurring within the last 10 years: No If all of the above answers are NO, then may proceed with Cephalosporin use.    Shellfish Allergy    Sulfa Drugs Cross Reactors Swelling   Tetracyclines & Related Swelling   Tramadol Rash    Outpatient Medications Prior to Visit  Medication Sig   albuterol  (PROVENTIL  HFA;VENTOLIN  HFA) 108 (90 BASE) MCG/ACT inhaler Inhale 2 puffs into the lungs every 6 (six) hours as needed for wheezing or shortness of breath.    albuterol   (PROVENTIL ) (2.5 MG/3ML) 0.083% nebulizer solution Take 3 mLs (2.5 mg total) by nebulization every 6 (six) hours as needed for wheezing or shortness of breath.   atorvastatin  (LIPITOR) 20 MG tablet TAKE 1 TABLET BY MOUTH EVERY DAY FOR CHOLESTEROL   benazepril-hydrochlorthiazide (LOTENSIN HCT) 20-12.5 MG tablet TAKE 1 TABLET BY MOUTH EVERY DAY FOR BLOOD PRESSURE   budesonide -formoterol  (SYMBICORT) 160-4.5 MCG/ACT inhaler Inhale 2 puffs into the lungs 2 (two) times daily.   Cholecalciferol  (VITAMIN D3) 1.25 MG (50000 UT) CAPS Take 1 capsule by mouth once a week.   DULoxetine  (CYMBALTA ) 60 MG capsule TAKE 1 CAPSULE BY MOUTH EVERY DAY   gabapentin  (NEURONTIN ) 600 MG tablet TAKE 1 TABLET BY MOUTH TWICE A DAY   meloxicam (MOBIC) 15 MG tablet TAKE 1 TABLET BY MOUTH EVERY DAY   metoprolol  succinate (TOPROL -XL) 25 MG 24 hr tablet TAKE 1 TABLET BY MOUTH EVERY DAY   montelukast  (SINGULAIR ) 10 MG tablet TAKE 1 TABLET BY MOUTH EVERY DAY   NARCAN 4 MG/0.1ML LIQD nasal spray kit 4 mg.   oxybutynin  (DITROPAN -XL) 5 MG 24 hr tablet Take 2.5 mg 2 (two) times daily by mouth.   oxyCODONE  (ROXICODONE ) 15 MG immediate release tablet Take 1 tablet every 6 (six) hours as needed by mouth.   potassium chloride  (KLOR-CON ) 10 MEQ tablet TAKE 1 TABLET BY MOUTH EVERY DAY   tiotropium (SPIRIVA ) 18 MCG inhalation capsule Place 18 mcg into inhaler and inhale daily.   TRELEGY ELLIPTA 100-62.5-25 MCG/ACT AEPB INHALE 1 PUFF BY MOUTH ONCE DAILY. RINSE MOUTH WELL AFTER USE   metoprolol  succinate (TOPROL -XL) 50 MG 24 hr tablet Take 50 mg by mouth daily. Take with or immediately following a meal. (Patient not taking: Reported on 10/06/2024)   MOVANTIK 25 MG TABS tablet Take 1 tablet daily by mouth. (Patient not taking: Reported on 10/06/2024)   No facility-administered medications prior to visit.    Review of Systems  Constitutional: Negative.   HENT: Negative.    Eyes: Negative.   Respiratory: Negative.  Negative for shortness of  breath.   Cardiovascular:  Positive for leg swelling. Negative for chest pain.  Gastrointestinal: Negative.  Negative for abdominal pain, constipation and diarrhea.  Genitourinary: Negative.   Musculoskeletal:  Negative for joint pain and myalgias.  Skin: Negative.   Neurological: Negative.  Negative for dizziness and headaches.  Endo/Heme/Allergies: Negative.   All other systems reviewed and are negative.      Objective:   BP (!) 146/62   Pulse 78   Ht 5' 2 (1.575 m)   Wt 93 lb 12.8 oz (42.5 kg)   SpO2 99%   BMI 17.16 kg/m   Vitals:   10/06/24 1024  BP: (!) 146/62  Pulse: 78  Height: 5' 2 (1.575 m)  Weight: 93 lb 12.8 oz (42.5 kg)  SpO2: 99%  BMI (Calculated): 17.15    Physical Exam Vitals and nursing note reviewed.  Constitutional:      Appearance: Normal appearance. She is normal weight.  HENT:     Head: Normocephalic and atraumatic.     Nose: Nose normal.     Mouth/Throat:     Mouth: Mucous membranes are moist.  Eyes:     Extraocular Movements: Extraocular movements intact.     Conjunctiva/sclera: Conjunctivae normal.     Pupils: Pupils are equal, round, and reactive to light.  Cardiovascular:     Rate and Rhythm: Normal rate and regular rhythm.     Pulses: Normal pulses.     Heart sounds: Normal heart sounds.  Pulmonary:     Effort: Pulmonary effort is normal.     Breath sounds: Normal breath sounds.  Abdominal:     General: Abdomen is flat. Bowel sounds are normal.     Palpations: Abdomen is soft.  Musculoskeletal:        General: Normal range of motion.     Cervical back: Normal range of motion.  Skin:    General: Skin is warm and dry.  Neurological:     General: No focal deficit present.     Mental Status: She is alert and oriented to person, place, and time.  Psychiatric:        Mood and Affect: Mood normal.        Behavior: Behavior normal.        Thought Content: Thought content normal.        Judgment: Judgment normal.      No  results found for any visits on 10/06/24.  Recent Results (from the past 2160 hours)  CBC with Differential     Status: Abnormal   Collection Time: 09/20/24  7:32 PM  Result Value Ref Range   WBC 13.2 (H) 4.0 - 10.5 K/uL   RBC 4.26 3.87 - 5.11 MIL/uL   Hemoglobin 12.7 12.0 - 15.0 g/dL   HCT 61.0 63.9 - 53.9 %   MCV 91.3 80.0 - 100.0 fL   MCH 29.8 26.0 - 34.0 pg   MCHC 32.6 30.0 - 36.0 g/dL   RDW 86.5 88.4 - 84.4 %   Platelets 423 (H) 150 - 400 K/uL   nRBC 0.0 0.0 - 0.2 %   Neutrophils Relative % 58 %   Neutro Abs 7.9 (H) 1.7 - 7.7 K/uL   Lymphocytes Relative 26 %   Lymphs Abs 3.4 0.7 - 4.0 K/uL   Monocytes Relative 11 %   Monocytes Absolute 1.4 (H) 0.1 - 1.0 K/uL   Eosinophils Relative 3 %   Eosinophils Absolute 0.4 0.0 - 0.5 K/uL   Basophils Relative 1 %   Basophils Absolute 0.1 0.0 - 0.1 K/uL   Immature Granulocytes 1 %   Abs Immature Granulocytes 0.06 0.00 - 0.07 K/uL    Comment: Performed at [facility], [address] [place], [place] [address]  Basic metabolic panel     Status: Abnormal   Collection Time: 09/20/24  7:32 PM  Result Value Ref Range   Sodium 134 (L) 135 - 145 mmol/L   Potassium 3.9 3.5 - 5.1 mmol/L   Chloride 97 (L) 98 - 111 mmol/L   CO2 29 22 - 32 mmol/L   Glucose, Bld 105 (H) 70 - 99 mg/dL  Comment: Glucose reference range applies only to samples taken after fasting for at least 8 hours.   BUN 18 8 - 23 mg/dL   Creatinine, Ser 9.16 0.44 - 1.00 mg/dL   Calcium  9.0 8.9 - 10.3 mg/dL   GFR, Estimated >39 >39 mL/min    Comment: (NOTE) Calculated using the CKD-EPI Creatinine Equation (2021)    Anion gap 8 5 - 15    Comment: Performed at [facility], [address] [place], [place] [address]  D-dimer, quantitative     Status: Abnormal   Collection Time: 09/20/24  7:32 PM  Result Value Ref Range   D-Dimer, Quant 1.61 (H) 0.00 - 0.50 ug/mL-FEU    Comment: (NOTE) At the manufacturer cut-off value of 0.5 g/mL FEU, this assay  has a negative predictive value of 95-100%.This assay is intended for use in conjunction with a clinical pretest probability (PTP) assessment model to exclude pulmonary embolism (PE) and deep venous thrombosis (DVT) in outpatients suspected of PE or DVT. Results should be correlated with clinical presentation. Performed at [facility], [address] [place], [place] [address]   Brain natriuretic peptide     Status: None   Collection Time: 09/20/24  7:32 PM  Result Value Ref Range   B Natriuretic Peptide 37.4 0.0 - 100.0 pg/mL    Comment: Performed at [facility], [address] [place], [place] [address]  Urinalysis, w/ Reflex to Culture (Infection Suspected) -Urine, Clean Catch     Status: Abnormal   Collection Time: 09/20/24  9:50 PM  Result Value Ref Range   Specimen Source URINE, CLEAN CATCH    Color, Urine YELLOW (A) YELLOW   APPearance HAZY (A) CLEAR   Specific Gravity, Urine 1.015 1.005 - 1.030   pH 6.0 5.0 - 8.0   Glucose, UA NEGATIVE NEGATIVE mg/dL   Hgb urine dipstick NEGATIVE NEGATIVE   Bilirubin Urine NEGATIVE NEGATIVE   Ketones, ur NEGATIVE NEGATIVE mg/dL   Protein, ur NEGATIVE NEGATIVE mg/dL   Nitrite NEGATIVE NEGATIVE   Leukocytes,Ua TRACE (A) NEGATIVE   RBC / HPF 0-5 0 - 5 RBC/hpf   WBC, UA 21-50 0 - 5 WBC/hpf    Comment:        Reflex urine culture not performed if WBC <=10, OR if Squamous epithelial cells >5. If Squamous epithelial cells >5 suggest recollection.    Bacteria, UA MANY (A) NONE SEEN   Squamous Epithelial / HPF 0-5 0 - 5 /HPF   Mucus PRESENT     Comment: Performed at [facility], [address] [place], [place] [address]  Urine Culture     Status: Abnormal   Collection Time: 09/20/24  9:50 PM   Specimen: Urine, Random  Result Value Ref Range   Specimen Description      URINE, RANDOM Performed at [facility], [address] [place], [place] [address]    Special Requests      NONE Reflexed  from [phone] Performed at [facility], [address] [place], [place] [address]    Culture >=100,000 COLONIES/mL KLEBSIELLA PNEUMONIAE (A)    Report Status 09/23/2024 FINAL    Organism ID, Bacteria KLEBSIELLA PNEUMONIAE (A)       Susceptibility   Klebsiella pneumoniae - MIC*    AMPICILLIN RESISTANT Resistant     CEFAZOLIN (URINE) Value in next row Sensitive      2 SENSITIVEThis is a modified FDA-approved test that has been validated and its performance characteristics determined by  the reporting laboratory.  This laboratory is certified under the Clinical Laboratory Improvement Amendments CLIA as qualified to perform high complexity clinical laboratory testing.    CEFEPIME  Value in next row Sensitive      2 SENSITIVEThis is a modified FDA-approved test that has been validated and its performance characteristics determined by the reporting laboratory.  This laboratory is certified under the Clinical Laboratory Improvement Amendments CLIA as qualified to perform high complexity clinical laboratory testing.    ERTAPENEM Value in next row Sensitive      2 SENSITIVEThis is a modified FDA-approved test that has been validated and its performance characteristics determined by the reporting laboratory.  This laboratory is certified under the Clinical Laboratory Improvement Amendments CLIA as qualified to perform high complexity clinical laboratory testing.    CEFTRIAXONE  Value in next row Sensitive      2 SENSITIVEThis is a modified FDA-approved test that has been validated and its performance characteristics determined by the reporting laboratory.  This laboratory is certified under the Clinical Laboratory Improvement Amendments CLIA as qualified to perform high complexity clinical laboratory testing.    CIPROFLOXACIN  Value in next row Sensitive      2 SENSITIVEThis is a modified FDA-approved test that has been validated and its performance characteristics determined by the reporting  laboratory.  This laboratory is certified under the Clinical Laboratory Improvement Amendments CLIA as qualified to perform high complexity clinical laboratory testing.    GENTAMICIN Value in next row Sensitive      2 SENSITIVEThis is a modified FDA-approved test that has been validated and its performance characteristics determined by the reporting laboratory.  This laboratory is certified under the Clinical Laboratory Improvement Amendments CLIA as qualified to perform high complexity clinical laboratory testing.    NITROFURANTOIN  Value in next row Intermediate      2 SENSITIVEThis is a modified FDA-approved test that has been validated and its performance characteristics determined by the reporting laboratory.  This laboratory is certified under the Clinical Laboratory Improvement Amendments CLIA as qualified to perform high complexity clinical laboratory testing.    TRIMETH/SULFA Value in next row Sensitive      2 SENSITIVEThis is a modified FDA-approved test that has been validated and its performance characteristics determined by the reporting laboratory.  This laboratory is certified under the Clinical Laboratory Improvement Amendments CLIA as qualified to perform high complexity clinical laboratory testing.    AMPICILLIN/SULBACTAM Value in next row Sensitive      2 SENSITIVEThis is a modified FDA-approved test that has been validated and its performance characteristics determined by the reporting laboratory.  This laboratory is certified under the Clinical Laboratory Improvement Amendments CLIA as qualified to perform high complexity clinical laboratory testing.    PIP/TAZO Value in next row Sensitive      <=4 SENSITIVEThis is a modified FDA-approved test that has been validated and its performance characteristics determined by the reporting laboratory.  This laboratory is certified under the Clinical Laboratory Improvement Amendments CLIA as qualified to perform high complexity clinical laboratory  testing.    MEROPENEM Value in next row Sensitive      <=4 SENSITIVEThis is a modified FDA-approved test that has been validated and its performance characteristics determined by the reporting laboratory.  This laboratory is certified under the Clinical Laboratory Improvement Amendments CLIA as qualified to perform high complexity clinical laboratory testing.    * >=100,000 COLONIES/mL KLEBSIELLA PNEUMONIAE      Assessment & Plan:  Cipro  Referral sent to audiology Referral sent  to ophthalmology Referral sent to podiatry  Problem List Items Addressed This Visit       Nervous and Auditory   Hearing loss   Relevant Orders   Ambulatory referral to Audiology     Other   Cellulitis of left lower leg   Relevant Orders   Ambulatory referral to Podiatry   Other Visit Diagnoses       Encounter for nail care    -  Primary   Relevant Orders   Ambulatory referral to Podiatry     Eye exam, routine       Relevant Orders   Ambulatory referral to Ophthalmology       Return in about 4 weeks (around 11/03/2024) for with Alan.   Total time spent: 25 minutes  [employer], NP  10/06/2024   This document may have been prepared by Dragon Voice Recognition software and as such may include unintentional dictation errors.

## 2024-10-08 ENCOUNTER — Other Ambulatory Visit: Payer: Self-pay

## 2024-10-08 ENCOUNTER — Other Ambulatory Visit: Payer: Self-pay | Admitting: Family

## 2024-10-08 DIAGNOSIS — G894 Chronic pain syndrome: Secondary | ICD-10-CM

## 2024-10-09 ENCOUNTER — Other Ambulatory Visit: Payer: Self-pay | Admitting: Family

## 2024-10-09 MED ORDER — POTASSIUM CHLORIDE ER 10 MEQ PO TBCR: 10.0000 meq | EXTENDED_RELEASE_TABLET | Freq: Every day | ORAL | 1 refills | Status: AC

## 2024-10-09 MED ORDER — TRELEGY ELLIPTA 100-62.5-25 MCG/ACT IN AEPB: INHALATION_SPRAY | RESPIRATORY_TRACT | 3 refills | Status: AC

## 2024-10-09 MED ORDER — BENAZEPRIL-HYDROCHLOROTHIAZIDE 20-12.5 MG PO TABS: 1.0000 | ORAL_TABLET | Freq: Every day | ORAL | 0 refills | Status: DC

## 2024-10-13 ENCOUNTER — Ambulatory Visit: Admitting: Cardiology

## 2024-10-18 ENCOUNTER — Other Ambulatory Visit: Payer: Self-pay | Admitting: Family

## 2024-11-05 ENCOUNTER — Encounter: Payer: Self-pay | Admitting: Oncology

## 2024-11-05 ENCOUNTER — Ambulatory Visit: Admitting: Family

## 2024-12-03 ENCOUNTER — Other Ambulatory Visit: Payer: Self-pay | Admitting: Cardiology

## 2024-12-04 ENCOUNTER — Other Ambulatory Visit: Payer: Self-pay | Admitting: Family

## 2024-12-07 ENCOUNTER — Other Ambulatory Visit: Payer: Self-pay | Admitting: Family

## 2024-12-26 ENCOUNTER — Other Ambulatory Visit: Payer: Self-pay | Admitting: Family

## 2024-12-27 ENCOUNTER — Other Ambulatory Visit: Payer: Self-pay | Admitting: Family

## 2024-12-29 ENCOUNTER — Other Ambulatory Visit: Payer: Self-pay | Admitting: Family
